# Patient Record
Sex: Male | Born: 1945 | Race: Black or African American | Hispanic: No | Marital: Married
Health system: Southern US, Community
[De-identification: ages and names within clinical notes are randomized; demographics above are authoritative.]

## PROBLEM LIST (undated history)

## (undated) DIAGNOSIS — F329 Major depressive disorder, single episode, unspecified: Secondary | ICD-10-CM

## (undated) DIAGNOSIS — H409 Unspecified glaucoma: Secondary | ICD-10-CM

## (undated) DIAGNOSIS — J181 Lobar pneumonia, unspecified organism: Secondary | ICD-10-CM

## (undated) DIAGNOSIS — I482 Chronic atrial fibrillation, unspecified: Secondary | ICD-10-CM

## (undated) DIAGNOSIS — F32A Depression, unspecified: Secondary | ICD-10-CM

## (undated) DIAGNOSIS — G9341 Metabolic encephalopathy: Secondary | ICD-10-CM

## (undated) DIAGNOSIS — F419 Anxiety disorder, unspecified: Secondary | ICD-10-CM

## (undated) DIAGNOSIS — D649 Anemia, unspecified: Secondary | ICD-10-CM

## (undated) DIAGNOSIS — J9621 Acute and chronic respiratory failure with hypoxia: Principal | ICD-10-CM

## (undated) DIAGNOSIS — F0280 Dementia in other diseases classified elsewhere without behavioral disturbance: Secondary | ICD-10-CM

## (undated) DIAGNOSIS — J449 Chronic obstructive pulmonary disease, unspecified: Secondary | ICD-10-CM

## (undated) DIAGNOSIS — G309 Alzheimer's disease, unspecified: Secondary | ICD-10-CM

---

## 2018-02-13 ENCOUNTER — Inpatient Hospital Stay
Admission: AD | Admit: 2018-02-13 | Discharge: 2018-03-26 | Disposition: E | Payer: Medicare Other | Source: Other Acute Inpatient Hospital | Attending: Internal Medicine | Admitting: Internal Medicine

## 2018-02-13 ENCOUNTER — Other Ambulatory Visit (HOSPITAL_COMMUNITY): Payer: Medicare Other

## 2018-02-13 ENCOUNTER — Ambulatory Visit (HOSPITAL_COMMUNITY)
Admission: AD | Admit: 2018-02-13 | Discharge: 2018-02-13 | Disposition: A | Discharge status: Home / Self Care | Payer: Medicare Other | Source: Other Acute Inpatient Hospital | Attending: Internal Medicine | Admitting: Internal Medicine

## 2018-02-13 DIAGNOSIS — Z4659 Encounter for fitting and adjustment of other gastrointestinal appliance and device: Secondary | ICD-10-CM

## 2018-02-13 DIAGNOSIS — J9621 Acute and chronic respiratory failure with hypoxia: Secondary | ICD-10-CM

## 2018-02-13 DIAGNOSIS — J9690 Respiratory failure, unspecified, unspecified whether with hypoxia or hypercapnia: Secondary | ICD-10-CM

## 2018-02-13 DIAGNOSIS — T85598A Other mechanical complication of other gastrointestinal prosthetic devices, implants and grafts, initial encounter: Secondary | ICD-10-CM

## 2018-02-13 DIAGNOSIS — Z01818 Encounter for other preprocedural examination: Secondary | ICD-10-CM

## 2018-02-13 DIAGNOSIS — G9341 Metabolic encephalopathy: Secondary | ICD-10-CM

## 2018-02-13 DIAGNOSIS — Z431 Encounter for attention to gastrostomy: Secondary | ICD-10-CM

## 2018-02-13 DIAGNOSIS — I482 Chronic atrial fibrillation, unspecified: Secondary | ICD-10-CM

## 2018-02-13 DIAGNOSIS — Z0189 Encounter for other specified special examinations: Secondary | ICD-10-CM

## 2018-02-13 DIAGNOSIS — J449 Chronic obstructive pulmonary disease, unspecified: Secondary | ICD-10-CM | POA: Diagnosis present

## 2018-02-13 DIAGNOSIS — J189 Pneumonia, unspecified organism: Secondary | ICD-10-CM

## 2018-02-13 DIAGNOSIS — T8579XA Infection and inflammatory reaction due to other internal prosthetic devices, implants and grafts, initial encounter: Secondary | ICD-10-CM

## 2018-02-13 DIAGNOSIS — J181 Lobar pneumonia, unspecified organism: Secondary | ICD-10-CM

## 2018-02-13 HISTORY — DX: Lobar pneumonia, unspecified organism: J18.1

## 2018-02-13 HISTORY — DX: Anxiety disorder, unspecified: F41.9

## 2018-02-13 HISTORY — DX: Depression, unspecified: F32.A

## 2018-02-13 HISTORY — DX: Dementia in other diseases classified elsewhere, unspecified severity, without behavioral disturbance, psychotic disturbance, mood disturbance, and anxiety: F02.80

## 2018-02-13 HISTORY — DX: Major depressive disorder, single episode, unspecified: F32.9

## 2018-02-13 HISTORY — DX: Chronic obstructive pulmonary disease, unspecified: J44.9

## 2018-02-13 HISTORY — DX: Acute and chronic respiratory failure with hypoxia: J96.21

## 2018-02-13 HISTORY — DX: Anemia, unspecified: D64.9

## 2018-02-13 HISTORY — DX: Chronic atrial fibrillation, unspecified: I48.20

## 2018-02-13 HISTORY — DX: Unspecified glaucoma: H40.9

## 2018-02-13 HISTORY — DX: Metabolic encephalopathy: G93.41

## 2018-02-13 HISTORY — DX: Alzheimer's disease, unspecified: G30.9

## 2018-02-14 ENCOUNTER — Encounter: Payer: Self-pay | Admitting: Internal Medicine

## 2018-02-14 DIAGNOSIS — J181 Lobar pneumonia, unspecified organism: Secondary | ICD-10-CM | POA: Diagnosis not present

## 2018-02-14 DIAGNOSIS — J9621 Acute and chronic respiratory failure with hypoxia: Secondary | ICD-10-CM

## 2018-02-14 DIAGNOSIS — J449 Chronic obstructive pulmonary disease, unspecified: Secondary | ICD-10-CM | POA: Diagnosis present

## 2018-02-14 DIAGNOSIS — G9341 Metabolic encephalopathy: Secondary | ICD-10-CM

## 2018-02-14 DIAGNOSIS — I482 Chronic atrial fibrillation, unspecified: Secondary | ICD-10-CM

## 2018-02-14 DIAGNOSIS — J4489 Other specified chronic obstructive pulmonary disease: Secondary | ICD-10-CM | POA: Diagnosis present

## 2018-02-14 HISTORY — DX: Lobar pneumonia, unspecified organism: J18.1

## 2018-02-14 HISTORY — DX: Chronic atrial fibrillation, unspecified: I48.20

## 2018-02-14 HISTORY — DX: Acute and chronic respiratory failure with hypoxia: J96.21

## 2018-02-14 HISTORY — DX: Metabolic encephalopathy: G93.41

## 2018-02-14 LAB — CBC
HEMATOCRIT: 36.3 % — AB (ref 39.0–52.0)
HEMOGLOBIN: 11.2 g/dL — AB (ref 13.0–17.0)
MCH: 27 pg (ref 26.0–34.0)
MCHC: 30.9 g/dL (ref 30.0–36.0)
MCV: 87.5 fL (ref 78.0–100.0)
Platelets: 363 10*3/uL (ref 150–400)
RBC: 4.15 MIL/uL — AB (ref 4.22–5.81)
RDW: 17.4 % — ABNORMAL HIGH (ref 11.5–15.5)
WBC: 18.4 10*3/uL — ABNORMAL HIGH (ref 4.0–10.5)

## 2018-02-14 LAB — COMPREHENSIVE METABOLIC PANEL
ALK PHOS: 82 U/L (ref 38–126)
ALT: 205 U/L — AB (ref 0–44)
AST: 101 U/L — ABNORMAL HIGH (ref 15–41)
Albumin: 2.1 g/dL — ABNORMAL LOW (ref 3.5–5.0)
Anion gap: 6 (ref 5–15)
BILIRUBIN TOTAL: 0.9 mg/dL (ref 0.3–1.2)
BUN: 33 mg/dL — ABNORMAL HIGH (ref 8–23)
CALCIUM: 9.4 mg/dL (ref 8.9–10.3)
CO2: 22 mmol/L (ref 22–32)
CREATININE: 0.8 mg/dL (ref 0.61–1.24)
Chloride: 114 mmol/L — ABNORMAL HIGH (ref 98–111)
GFR calc Af Amer: >60 mL/min (ref >60)
GFR calc non Af Amer: >60 mL/min (ref >60)
GLUCOSE: 148 mg/dL — AB (ref 70–99)
Potassium: 4.3 mmol/L (ref 3.5–5.1)
Sodium: 142 mmol/L (ref 135–145)
TOTAL PROTEIN: 5.4 g/dL — AB (ref 6.5–8.1)

## 2018-02-14 LAB — CK TOTAL AND CKMB (NOT AT ARMC)
CK, MB: 13.5 ng/mL — ABNORMAL HIGH (ref 0.5–5.0)
Relative Index: 11.8 — ABNORMAL HIGH (ref 0.0–2.5)
Total CK: 114 U/L (ref 49–397)

## 2018-02-14 LAB — MAGNESIUM: Magnesium: 2.3 mg/dL (ref 1.7–2.4)

## 2018-02-14 LAB — DIGOXIN LEVEL: Digoxin Level: <0.2 ng/mL — ABNORMAL LOW (ref 0.8–2.0)

## 2018-02-14 NOTE — Consult Note (Signed)
[facility]  Date of Service: 02/14/2018  PULMONARY CONSULT   Peter LeoDavid Mcdowell  ION:[id number]  DOB: [birth year]   DOA: 02/04/2018  Referring Physician: Carron CurieAli Hijazi, MD  HPI: Peter Mcdowell is a 72 y.o. male seen for follow up of Acute on Chronic Respiratory Failure.  Patient has multiple medical problems including respiratory failure encephalopathy dementia presented to the hospital basically for sepsis and shock.  On presentation patient was hypertensive started on aggressive antibiotic therapy to include vancomycin meropenem aztreonam and Zyvox.  Patient was also found to have C. difficile colitis which was treated by p.o. vancomycin.  Patient has had ongoing confusion also.  The patient was eventually found to have bilateral pneumonia which was felt to be healthcare associated.  Patient's antibiotics were adjusted also.  During the stay patient developed rhabdomyolysis.  Also with chronic atrial fibrillation.  Patient is now transferred to our facility for further management and weaning currently is on the ventilator on full vent support  Review of Systems:  ROS performed and is unremarkable other than noted above.  Past Medical History:  Diagnosis Date  . Acute metabolic encephalopathy 02/14/2018  . Acute on chronic respiratory failure with hypoxia (HCC) 02/14/2018  . Alzheimer's dementia   . Anxiety   . Atrial fibrillation, chronic (HCC) 02/14/2018  . Chronic anemia   . Depression   . Glaucoma   . Lobar pneumonia (HCC) 02/14/2018  . Obstructive chronic bronchitis without exacerbation (HCC)     History reviewed. No pertinent surgical history.  Social History:    has an unknown smoking status. He has never used smokeless tobacco. He reports that he drank alcohol. He reports that he has current or past drug history.  Family History: Non-Contributory to the present illness  Allergies not on  file  Medications: Reviewed on Rounds  Physical Exam:  Vitals: Temperature 98.2 pulse 117 respiratory rate 28 blood pressure 115/69 saturations 94%  Ventilator Settings mode of ventilation assist control FiO2 20% tidal volume 497 PEEP 5  . General: Comfortable at this time . Eyes: Grossly normal lids, irises & conjunctiva . ENT: grossly tongue is normal . Neck: no obvious mass . Cardiovascular: S1-S2 normal irregular rhythm . Respiratory: Coarse breath sounds are noted few scattered rhonchi . Abdomen: Obese soft nontender . Skin: no rash seen on limited exam . Musculoskeletal: not rigid . Psychiatric:unable to assess . Neurologic: no seizure no involuntary movements         Labs on Admission:  Basic Metabolic Panel: Recent Labs  Lab 02/14/18 0612  NA 142  K 4.3  CL 114*  CO2 22  GLUCOSE 148*  BUN 33*  CREATININE 0.80  CALCIUM 9.4  MG 2.3    Liver Function Tests: Recent Labs  Lab 02/14/18 0612  AST 101*  ALT 205*  ALKPHOS 82  BILITOT 0.9  PROT 5.4*  ALBUMIN 2.1*   No results for input(s): LIPASE, AMYLASE in the last 168 hours. No results for input(s): AMMONIA in the last 168 hours.  CBC: Recent Labs  Lab 02/14/18 0612  WBC 18.4*  HGB 11.2*  HCT 36.3*  MCV 87.5  PLT 363    Cardiac Enzymes: Recent Labs  Lab 02/14/18 0612  CKTOTAL 114  CKMB 13.5*    BNP (last 3 results) No results for input(s): BNP in the last 8760 hours.  ProBNP (last 3 results) No results for input(s): PROBNP in the last 8760 hours.   Radiological Exams on Admission:  Dg Chest Port 1 View  Result Date: 02/14/2018 CLINICAL DATA:  Respiratory failure.  Shortness of breath. EXAM: PORTABLE CHEST 1 VIEW COMPARISON:  Abdominal radiograph earlier this day. FINDINGS: Enteric tube is in place with side-port not well visualized. Left upper extremity PICC with tip in the distal SVC. An additional tubing may project over the thoracic inlet, however is only faintly visualized and  overlaps the enteric tube. Heart size upper normal. Hazy opacity in the right lower hemithorax suspicious for combination of pleural fluid and atelectasis/airspace disease. Lesser retrocardiac opacity. Patchy bilateral suprahilar opacities. No visualized pneumothorax, right lung apex not included in the field of view. The bones are under mineralized. IMPRESSION: 1. Enteric tube in place with side-port not well visualized. Possible additional tubing overlying the thoracic inlet which may be an endotracheal tube, however overlaps the enteric tube and may represent external overlap. Recommend correlation with support apparatus. Left upper extremity PICC tip in the SVC. 2. Hazy opacities throughout the right mid lower hemithorax likely combination of pleural fluid and airspace disease/atelectasis. Similar but lesser left basilar opacity. 3. Patchy bilateral suprahilar opacities may be vascular congestion, atelectasis or airspace disease. No prior exams available for comparison. Electronically Signed   By: Rubye Oaks M.D.   On: 02/14/2018 00:00   Dg Abd Portable 1v  Result Date: 02/12/2018 CLINICAL DATA:  Check gastric catheter placement EXAM: PORTABLE ABDOMEN - 1 VIEW COMPARISON:  None. FINDINGS: Gastric catheter is noted with the tip within the stomach. The proximal side port lies at the gastroesophageal junction. This could be advanced a few cm. No other focal abnormality is noted. IMPRESSION: Gastric catheter as described. Electronically Signed   By: Alcide Clever M.D.   On: 01/26/2018 21:55    Assessment/Plan Principal Problem:   Acute on chronic respiratory failure with hypoxia (HCC) Active Problems:   Acute metabolic encephalopathy   Lobar pneumonia (HCC)   Atrial fibrillation, chronic (HCC)   Obstructive chronic bronchitis without exacerbation (HCC)   1. Acute on chronic respiratory failure with hypoxia patient has been started on weaning protocol did about 2 hours on pressure support.  Seem  to tolerate it fairly well.  We will continue to advance to pressure support as tolerated continue secretion management pulmonary toilet.  Patient has an ET tube in place with difficult positioning.  May eventually need to have a tracheostomy 2. Acute metabolic encephalopathy as a result of the pneumonia as well as underlying dementia. 3. Healthcare associated pneumonia treated with antibiotics we will continue to follow-up on x-rays.  On the last chest x-ray patient did have some patchy infiltrates noted we will need to follow these to clearing 4. Chronic atrial fibrillation currently the rate is controlled we will continue with present management overall patient prognosis guarded. 5. COPD advanced disease right now we will continue with supportive care overall prognosis as mentioned is guarded  I have personally seen and evaluated the patient, evaluated laboratory and imaging results, formulated the assessment and plan and placed orders. The Patient requires high complexity decision making for assessment and support.  Case was discussed on Rounds with the Respiratory Therapy Staff Time Spent patient is critically ill in danger of cardiac arrest.  Patient has an unstable airway will need close monitoring.  I discussed the case with the primary care team as well as treatment team  Yevonne Pax, MD [facility] Pulmonary Critical Care Medicine Sleep Medicine

## 2018-02-15 DIAGNOSIS — I482 Chronic atrial fibrillation: Secondary | ICD-10-CM | POA: Diagnosis not present

## 2018-02-15 DIAGNOSIS — J9621 Acute and chronic respiratory failure with hypoxia: Secondary | ICD-10-CM | POA: Diagnosis not present

## 2018-02-15 DIAGNOSIS — J181 Lobar pneumonia, unspecified organism: Secondary | ICD-10-CM | POA: Diagnosis not present

## 2018-02-15 DIAGNOSIS — G9341 Metabolic encephalopathy: Secondary | ICD-10-CM | POA: Diagnosis not present

## 2018-02-15 LAB — BLOOD GAS, ARTERIAL
Acid-Base Excess: 1.9 mmol/L (ref 0.0–2.0)
Bicarbonate: 25.7 mmol/L (ref 20.0–28.0)
FIO2: 28
O2 SAT: 97.4 %
PEEP: 5 cmH2O
PH ART: 7.442 (ref 7.350–7.450)
Patient temperature: 99
RATE: 15 resp/min
VT: 480 mL
pCO2 arterial: 38.3 mmHg (ref 32.0–48.0)
pO2, Arterial: 99.8 mmHg (ref 83.0–108.0)

## 2018-02-15 NOTE — Progress Notes (Signed)
Pulmonary Critical Care Medicine [facility] [place]   PULMONARY CRITICAL CARE SERVICE  PROGRESS NOTE  Date of Service: 02/15/2018  Peter Mcdowell  ZOX:[id number]  DOB: 1945-11-30   DOA: 01/30/2018  Referring Physician: Carron Curie, MD  HPI: Dia Peter Mcdowell is a 72 y.o. male seen for follow up of Acute on Chronic Respiratory Failure.  Patient is on full support on pressure support at this time.  Has been weaning for a goal of 6 hours so far looks good.  Patient has ET tube in place high risk airway  Medications: Reviewed on Rounds  Physical Exam:  Vitals: Temperature 98.7 pulse 95 respiratory rate 22 blood pressure 109/63 saturations 99%  Ventilator Settings mode of ventilation pressure support FiO2 28% tidal volume 586 PEEP 5 pressure support 12  . General: Comfortable at this time . Eyes: Grossly normal lids, irises & conjunctiva . ENT: grossly tongue is normal . Neck: no obvious mass . Cardiovascular: S1 S2 normal no gallop . Respiratory: Scattered few distant rhonchi . Abdomen: soft . Skin: no rash seen on limited exam . Musculoskeletal: not rigid . Psychiatric:unable to assess . Neurologic: no seizure no involuntary movements         Lab Data:   Basic Metabolic Panel: Recent Labs  Lab 02/14/18 0612  NA 142  K 4.3  CL 114*  CO2 22  GLUCOSE 148*  BUN 33*  CREATININE 0.80  CALCIUM 9.4  MG 2.3    Liver Function Tests: Recent Labs  Lab 02/14/18 0612  AST 101*  ALT 205*  ALKPHOS 82  BILITOT 0.9  PROT 5.4*  ALBUMIN 2.1*   No results for input(s): LIPASE, AMYLASE in the last 168 hours. No results for input(s): AMMONIA in the last 168 hours.  CBC: Recent Labs  Lab 02/14/18 0612  WBC 18.4*  HGB 11.2*  HCT 36.3*  MCV 87.5  PLT 363    Cardiac Enzymes: Recent Labs  Lab 02/14/18 0612  CKTOTAL 114  CKMB 13.5*    BNP (last 3 results) No results for input(s): BNP in the last 8760 hours.  ProBNP (last 3 results) No results for  input(s): PROBNP in the last 8760 hours.  Radiological Exams: Dg Chest Port 1 View  Result Date: 02/14/2018 CLINICAL DATA:  Respiratory failure.  Shortness of breath. EXAM: PORTABLE CHEST 1 VIEW COMPARISON:  Abdominal radiograph earlier this day. FINDINGS: Enteric tube is in place with side-port not well visualized. Left upper extremity PICC with tip in the distal SVC. An additional tubing may project over the thoracic inlet, however is only faintly visualized and overlaps the enteric tube. Heart size upper normal. Hazy opacity in the right lower hemithorax suspicious for combination of pleural fluid and atelectasis/airspace disease. Lesser retrocardiac opacity. Patchy bilateral suprahilar opacities. No visualized pneumothorax, right lung apex not included in the field of view. The bones are under mineralized. IMPRESSION: 1. Enteric tube in place with side-port not well visualized. Possible additional tubing overlying the thoracic inlet which may be an endotracheal tube, however overlaps the enteric tube and may represent external overlap. Recommend correlation with support apparatus. Left upper extremity PICC tip in the SVC. 2. Hazy opacities throughout the right mid lower hemithorax likely combination of pleural fluid and airspace disease/atelectasis. Similar but lesser left basilar opacity. 3. Patchy bilateral suprahilar opacities may be vascular congestion, atelectasis or airspace disease. No prior exams available for comparison. Electronically Signed   By: Rubye Oaks M.D.   On: 02/14/2018 00:00   Dg Abd Portable  1v  Result Date: 02/10/2018 CLINICAL DATA:  Check gastric catheter placement EXAM: PORTABLE ABDOMEN - 1 VIEW COMPARISON:  None. FINDINGS: Gastric catheter is noted with the tip within the stomach. The proximal side port lies at the gastroesophageal junction. This could be advanced a few cm. No other focal abnormality is noted. IMPRESSION: Gastric catheter as described. Electronically  Signed   By: Alcide CleverMark  Lukens M.D.   On: 01/29/2018 21:55    Assessment/Plan Principal Problem:   Acute on chronic respiratory failure with hypoxia (HCC) Active Problems:   Acute metabolic encephalopathy   Lobar pneumonia (HCC)   Atrial fibrillation, chronic (HCC)   Obstructive chronic bronchitis without exacerbation (HCC)   1. Acute on chronic respiratory failure with hypoxia at this time patient is weaning we are going to shoot for goal of 6 hours.  Patient has a high risk airway with the ET tube in place and he has anatomical distorsion which requires ongoing close monitoring. 2. Acute metabolic encephalopathy grossly unchanged 3. Lobar pneumonia treated with antibiotics we will continue to follow along. 4. Chronic atrial fibrillation rate is controlled continue with present management 5. COPD severe disease continue with supportive care   I have personally seen and evaluated the patient, evaluated laboratory and imaging results, formulated the assessment and plan and placed orders. The Patient requires high complexity decision making for assessment and support.  Case was discussed on Rounds with the Respiratory Therapy Staff patient is critically ill with high risk airway time 35 minutes patient is in danger of cardiac arrest and death.  Peter PaxSaadat A Khan, MD [facility] Pulmonary Critical Care Medicine Sleep Medicine

## 2018-02-16 LAB — TSH: TSH: 1.638 u[IU]/mL (ref 0.350–4.500)

## 2018-02-17 LAB — CBC WITH DIFFERENTIAL/PLATELET
ABS IMMATURE GRANULOCYTES: 0.2 10*3/uL — AB (ref 0.0–0.1)
Basophils Absolute: 0 10*3/uL (ref 0.0–0.1)
Basophils Relative: 0 %
EOS PCT: 0 %
Eosinophils Absolute: 0 10*3/uL (ref 0.0–0.7)
HEMATOCRIT: 35.1 % — AB (ref 39.0–52.0)
HEMOGLOBIN: 10.5 g/dL — AB (ref 13.0–17.0)
Immature Granulocytes: 1 %
LYMPHS ABS: 0.8 10*3/uL (ref 0.7–4.0)
LYMPHS PCT: 5 %
MCH: 27 pg (ref 26.0–34.0)
MCHC: 29.9 g/dL — ABNORMAL LOW (ref 30.0–36.0)
MCV: 90.2 fL (ref 78.0–100.0)
MONO ABS: 0.7 10*3/uL (ref 0.1–1.0)
MONOS PCT: 5 %
Neutro Abs: 13.4 10*3/uL — ABNORMAL HIGH (ref 1.7–7.7)
Neutrophils Relative %: 89 %
Platelets: 207 10*3/uL (ref 150–400)
RBC: 3.89 MIL/uL — ABNORMAL LOW (ref 4.22–5.81)
RDW: 18.6 % — ABNORMAL HIGH (ref 11.5–15.5)
WBC: 15 10*3/uL — ABNORMAL HIGH (ref 4.0–10.5)

## 2018-02-17 LAB — COMPREHENSIVE METABOLIC PANEL
ALK PHOS: 58 U/L (ref 38–126)
ALT: 64 U/L — AB (ref 0–44)
ANION GAP: 5 (ref 5–15)
AST: 33 U/L (ref 15–41)
Albumin: 1.7 g/dL — ABNORMAL LOW (ref 3.5–5.0)
BILIRUBIN TOTAL: 0.2 mg/dL — AB (ref 0.3–1.2)
BUN: 24 mg/dL — ABNORMAL HIGH (ref 8–23)
CALCIUM: 8.4 mg/dL — AB (ref 8.9–10.3)
CO2: 27 mmol/L (ref 22–32)
CREATININE: 0.63 mg/dL (ref 0.61–1.24)
Chloride: 115 mmol/L — ABNORMAL HIGH (ref 98–111)
GFR calc Af Amer: >60 mL/min (ref >60)
GFR calc non Af Amer: >60 mL/min (ref >60)
GLUCOSE: 113 mg/dL — AB (ref 70–99)
Potassium: 3.9 mmol/L (ref 3.5–5.1)
Sodium: 147 mmol/L — ABNORMAL HIGH (ref 135–145)
TOTAL PROTEIN: 4.6 g/dL — AB (ref 6.5–8.1)

## 2018-02-17 LAB — MAGNESIUM: Magnesium: 2.4 mg/dL (ref 1.7–2.4)

## 2018-02-18 ENCOUNTER — Other Ambulatory Visit (HOSPITAL_COMMUNITY): Payer: Medicare Other

## 2018-02-18 DIAGNOSIS — I482 Chronic atrial fibrillation: Secondary | ICD-10-CM | POA: Diagnosis not present

## 2018-02-18 DIAGNOSIS — J181 Lobar pneumonia, unspecified organism: Secondary | ICD-10-CM | POA: Diagnosis not present

## 2018-02-18 DIAGNOSIS — G9341 Metabolic encephalopathy: Secondary | ICD-10-CM | POA: Diagnosis not present

## 2018-02-18 DIAGNOSIS — J9621 Acute and chronic respiratory failure with hypoxia: Secondary | ICD-10-CM | POA: Diagnosis not present

## 2018-02-18 LAB — VITAMIN B12: Vitamin B-12: 378 pg/mL (ref 180–914)

## 2018-02-18 NOTE — Progress Notes (Signed)
[facility]   PULMONARY CRITICAL CARE SERVICE  PROGRESS NOTE  Date of Service: 02/18/2018  Peter LeoDavid Funari  XBJ:[id number]  DOB: 1945/09/15   DOA: 11-12-17  Referring Physician: Carron CurieAli Hijazi, MD  HPI: Peter Mcdowell is a 72 y.o. male seen for follow up of Acute on Chronic Respiratory Failure.  Patient is on pressure support weaning protocol tolerating well so far  Medications: Reviewed on Rounds  Physical Exam:  Vitals: Temperature 98.4 pulse 95 respiratory 24 blood pressure 118/65 saturations 96%  Ventilator Settings mode of ventilation pressure support FiO2 20% tidal 2522 pressure support 12 PEEP 5  . General: Comfortable at this time . Eyes: Grossly normal lids, irises & conjunctiva . ENT: grossly tongue is normal . Neck: no obvious mass . Cardiovascular: S1 S2 normal no gallop . Respiratory: No rhonchi no rales are noted . Abdomen: soft . Skin: no rash seen on limited exam . Musculoskeletal: not rigid . Psychiatric:unable to assess . Neurologic: no seizure no involuntary movements         Lab Data:   Basic Metabolic Panel: Recent Labs  Lab 02/14/18 0612 02/17/18 0909  NA 142 147*  K 4.3 3.9  CL 114* 115*  CO2 22 27  GLUCOSE 148* 113*  BUN 33* 24*  CREATININE 0.80 0.63  CALCIUM 9.4 8.4*  MG 2.3 2.4    Liver Function Tests: Recent Labs  Lab 02/14/18 0612 02/17/18 0909  AST 101* 33  ALT 205* 64*  ALKPHOS 82 58  BILITOT 0.9 0.2*  PROT 5.4* 4.6*  ALBUMIN 2.1* 1.7*   No results for input(s): LIPASE, AMYLASE in the last 168 hours. No results for input(s): AMMONIA in the last 168 hours.  CBC: Recent Labs  Lab 02/14/18 0612 02/17/18 0909  WBC 18.4* 15.0*  NEUTROABS  --  13.4*  HGB 11.2* 10.5*  HCT 36.3* 35.1*  MCV 87.5 90.2  PLT 363 207    Cardiac Enzymes: Recent Labs  Lab 02/14/18 0612  CKTOTAL 114  CKMB 13.5*    BNP (last 3 results) No results for input(s): BNP in the last 8760  hours.  ProBNP (last 3 results) No results for input(s): PROBNP in the last 8760 hours.  Radiological Exams: No results found.  Assessment/Plan Principal Problem:   Acute on chronic respiratory failure with hypoxia (HCC) Active Problems:   Acute metabolic encephalopathy   Lobar pneumonia (HCC)   Atrial fibrillation, chronic (HCC)   Obstructive chronic bronchitis without exacerbation (HCC)   1. Acute on chronic respiratory failure with hypoxia we will continue weaning on the protocol continue pulmonary toilet secretion management 2. Acute metabolic encephalopathy at baseline we will monitor 3. Lobar pneumonia treated continue to follow 4. Atrial fibrillation chronic rate controlled 5. COPD at baseline   I have personally seen and evaluated the patient, evaluated laboratory and imaging results, formulated the assessment and plan and placed orders. The Patient requires high complexity decision making for assessment and support.  Case was discussed on Rounds with the Respiratory Therapy Staff  Yevonne PaxSaadat A Brynne Doane, MD [facility] Pulmonary Critical Care Medicine Sleep Medicine

## 2018-02-19 ENCOUNTER — Inpatient Hospital Stay (HOSPITAL_COMMUNITY)
Admission: AD | Admit: 2018-02-19 | Discharge: 2018-02-19 | Disposition: A | Discharge status: Home / Self Care | Payer: Medicare Other | Source: Other Acute Inpatient Hospital | Attending: Internal Medicine | Admitting: Internal Medicine

## 2018-02-19 DIAGNOSIS — J181 Lobar pneumonia, unspecified organism: Secondary | ICD-10-CM | POA: Diagnosis not present

## 2018-02-19 DIAGNOSIS — J9621 Acute and chronic respiratory failure with hypoxia: Secondary | ICD-10-CM | POA: Diagnosis not present

## 2018-02-19 DIAGNOSIS — G9341 Metabolic encephalopathy: Secondary | ICD-10-CM | POA: Diagnosis not present

## 2018-02-19 DIAGNOSIS — I482 Chronic atrial fibrillation: Secondary | ICD-10-CM | POA: Diagnosis not present

## 2018-02-19 NOTE — Procedures (Signed)
ELECTROENCEPHALOGRAM REPORT   Patient: Peter Mcdowell       Room #: 5E04C EEG No. ID: [id number] Age: 72 y.o.        Sex: male Referring Physician: Hijazi Report Date:  02/19/2018        Interpreting Physician: Thana FarrEYNOLDS, Lucio Litsey  History: Peter Mcdowell is an 72 y.o. male with acute on chronic respiratory failure.    Medications:  None listed  Conditions of Recording:  This is a 21 channel routine scalp EEG performed with bipolar and monopolar montages arranged in accordance to the international 10/20 system of electrode placement. One channel was dedicated to EKG recording.  The patient is in the intubated but not sedated state.  Description:  The background activity is slow and consists of a low voltage mixture of theta and delta activity.  This activity is poorly organized and diffusely distributed.  There are also frequent periodic discharges of triphasic morphology.  These are noted bilaterally and have a frontal predominance. Stage II sleep is not obtained. Hyperventilation and intermittent photic stimulation were not performed.   IMPRESSION: This is an abnormal electroencephalogram secondary to general background slowing and frequent triphasic activity.  This finding is most consistent with an encephalopathy.     Thana FarrLeslie Taffy Delconte, MD Neurology [phone] 02/19/2018, 2:15 PM

## 2018-02-19 NOTE — Progress Notes (Signed)
Pulmonary Critical Care Medicine [name] [place]   PULMONARY CRITICAL CARE SERVICE  PROGRESS NOTE  Date of Service: 02/19/2018  Peter Mcdowell Cullen  [id number]  DOB: [birth year]   DOA: 02/19/2018  Referring Physician: Carron CurieAli Hijazi, MD  HPI: Peter Mcdowell Hegeman is a 72 y.o. male seen for follow up of Acute on Chronic Respiratory Failure.  Patient had an EEG done which was abnormal showing background slowing and triphasic activity consistent with encephalopathy  Medications: Reviewed on Rounds  Physical Exam:  Vitals: Temperature 98.0 pulse 110 respiratory rate 22 blood pressure 107/71 saturations 99%  Ventilator Settings currently on pressure support FiO2 20% per support 12 PEEP 5  . General: Comfortable at this time . Eyes: Grossly normal lids, irises & conjunctiva . ENT: grossly tongue is normal . Neck: no obvious mass . Cardiovascular: S1 S2 normal no gallop . Respiratory: No rhonchi no rales at this time. . Abdomen: soft . Skin: no rash seen on limited exam . Musculoskeletal: not rigid . Psychiatric:unable to assess . Neurologic: no seizure no involuntary movements         Lab Data:   Basic Metabolic Panel: Recent Labs  Lab 02/14/18 0612 02/17/18 0909  NA 142 147*  K 4.3 3.9  CL 114* 115*  CO2 22 27  GLUCOSE 148* 113*  BUN 33* 24*  CREATININE 0.80 0.63  CALCIUM 9.4 8.4*  MG 2.3 2.4    Liver Function Tests: Recent Labs  Lab 02/14/18 0612 02/17/18 0909  AST 101* 33  ALT 205* 64*  ALKPHOS 82 58  BILITOT 0.9 0.2*  PROT 5.4* 4.6*  ALBUMIN 2.1* 1.7*   No results for input(s): LIPASE, AMYLASE in the last 168 hours. No results for input(s): AMMONIA in the last 168 hours.  CBC: Recent Labs  Lab 02/14/18 0612 02/17/18 0909  WBC 18.4* 15.0*  NEUTROABS  --  13.4*  HGB 11.2* 10.5*  HCT 36.3* 35.1*  MCV 87.5 90.2  PLT 363 207    Cardiac Enzymes: Recent Labs  Lab 02/14/18 0612  CKTOTAL 114  CKMB 13.5*    BNP (last 3 results) No results  for input(s): BNP in the last 8760 hours.  ProBNP (last 3 results) No results for input(s): PROBNP in the last 8760 hours.  Radiological Exams: No results found.  Assessment/Plan Principal Problem:   Acute on chronic respiratory failure with hypoxia (HCC) Active Problems:   Acute metabolic encephalopathy   Lobar pneumonia (HCC)   Atrial fibrillation, chronic (HCC)   Obstructive chronic bronchitis without exacerbation (HCC)   1. Acute on chronic respiratory failure with hypoxia we will continue with weaning protocol right now on pressure support goal for 16 hours 2. Metabolic encephalopathy as by EEG shows chronic encephalopathy 3. Lobar pneumonia treated with antibiotics we will continue to follow 4. Chronic atrial fibrillation rate is controlled we will monitor 5. COPD stable at this time continue present therapy   I have personally seen and evaluated the patient, evaluated laboratory and imaging results, formulated the assessment and plan and placed orders. The Patient requires high complexity decision making for assessment and support.  Case was discussed on Rounds with the Respiratory Therapy Staff  Yevonne PaxSaadat A Khan, MD [facility] Pulmonary Critical Care Medicine Sleep Medicine

## 2018-02-19 NOTE — Progress Notes (Signed)
EEG Completed; Results Pending  

## 2018-02-20 DIAGNOSIS — J181 Lobar pneumonia, unspecified organism: Secondary | ICD-10-CM | POA: Diagnosis not present

## 2018-02-20 DIAGNOSIS — J9621 Acute and chronic respiratory failure with hypoxia: Secondary | ICD-10-CM | POA: Diagnosis not present

## 2018-02-20 DIAGNOSIS — G9341 Metabolic encephalopathy: Secondary | ICD-10-CM | POA: Diagnosis not present

## 2018-02-20 DIAGNOSIS — I482 Chronic atrial fibrillation: Secondary | ICD-10-CM | POA: Diagnosis not present

## 2018-02-20 NOTE — Progress Notes (Signed)
[facility]ulmonary [facility] [place]   PULMONARY CRITICAL CARE SERVICE  PROGRESS NOTE  Date of Service: 02/20/2018  Peter Mcdowell  [id number]  DOB: 12-02-1945   DOA: 02/07/2018  Referring Physician: Carron CurieAli Hijazi, MD  HPI: Peter Mcdowell is a 72 y.o. male seen for follow up of Acute on Chronic Respiratory Failure.  Patient right now is on pressure support 25 daily well the goal is for 20 hours currently is on a pressure support 12/5  Medications: Reviewed on Rounds  Physical Exam:  Vitals: Temperature 97.0 pulse 82 respiratory rate 30 blood pressure 130/81 saturations 97%  Ventilator Settings currently is on pressure support wean as noted the goal is 20 hours pressure support 12 PEEP 5 FiO2 is 28%  . General: Comfortable at this time . Eyes: Grossly normal lids, irises & conjunctiva . ENT: grossly tongue is normal . Neck: no obvious mass . Cardiovascular: S1 S2 normal no gallop . Respiratory: Scattered few distant rhonchi noted . Abdomen: soft . Skin: no rash seen on limited exam . Musculoskeletal: not rigid . Psychiatric:unable to assess . Neurologic: no seizure no involuntary movements         Lab Data:   Basic Metabolic Panel: Recent Labs  Lab 02/14/18 0612 02/17/18 0909  NA 142 147*  K 4.3 3.9  CL 114* 115*  CO2 22 27  GLUCOSE 148* 113*  BUN 33* 24*  CREATININE 0.80 0.63  CALCIUM 9.4 8.4*  MG 2.3 2.4    Liver Function Tests: Recent Labs  Lab 02/14/18 0612 02/17/18 0909  AST 101* 33  ALT 205* 64*  ALKPHOS 82 58  BILITOT 0.9 0.2*  PROT 5.4* 4.6*  ALBUMIN 2.1* 1.7*   No results for input(s): LIPASE, AMYLASE in the last 168 hours. No results for input(s): AMMONIA in the last 168 hours.  CBC: Recent Labs  Lab 02/14/18 0612 02/17/18 0909  WBC 18.4* 15.0*  NEUTROABS  --  13.4*  HGB 11.2* 10.5*  HCT 36.3* 35.1*  MCV 87.5 90.2  PLT 363 207    Cardiac Enzymes: Recent Labs  Lab 02/14/18 0612  CKTOTAL 114  CKMB  13.5*    BNP (last 3 results) No results for input(s): BNP in the last 8760 hours.  ProBNP (last 3 results) No results for input(s): PROBNP in the last 8760 hours.  Radiological Exams: No results found.  Assessment/Plan Principal Problem:   Acute on chronic respiratory failure with hypoxia (HCC) Active Problems:   Acute metabolic encephalopathy   Lobar pneumonia (HCC)   Atrial fibrillation, chronic (HCC)   Obstructive chronic bronchitis without exacerbation (HCC)   1. Acute on chronic respiratory failure with hypoxia we will continue with weaning as tolerated.  Hopefully we will be working towards extubation soon 2. Acute metabolic encephalopathy unchanged patient remains at baseline 3. Lobar pneumonia treated we will continue to follow 4. Chronic atrial fibrillation rate is controlled 5. COPD severe disease we will continue with present management.   I have personally seen and evaluated the patient, evaluated laboratory and imaging results, formulated the assessment and plan and placed orders. The Patient requires high complexity decision making for assessment and support.  Case was discussed on Rounds with the Respiratory Therapy Staff  Yevonne PaxSaadat A Khan, MD [facility] Pulmonary Critical Care Medicine Sleep Medicine

## 2018-02-21 ENCOUNTER — Other Ambulatory Visit (HOSPITAL_COMMUNITY): Payer: Medicare Other

## 2018-02-21 DIAGNOSIS — J9621 Acute and chronic respiratory failure with hypoxia: Secondary | ICD-10-CM | POA: Diagnosis not present

## 2018-02-21 DIAGNOSIS — J181 Lobar pneumonia, unspecified organism: Secondary | ICD-10-CM | POA: Diagnosis not present

## 2018-02-21 DIAGNOSIS — G9341 Metabolic encephalopathy: Secondary | ICD-10-CM | POA: Diagnosis not present

## 2018-02-21 DIAGNOSIS — I482 Chronic atrial fibrillation: Secondary | ICD-10-CM | POA: Diagnosis not present

## 2018-02-21 NOTE — Progress Notes (Signed)
Pulmonary Critical Care Medicine [facility] [place]   PULMONARY CRITICAL CARE SERVICE  PROGRESS NOTE  Date of Service: 02/21/2018  Peter Mcdowell  WUJ:[id number]  DOB: [birth year]   DOA: 02/04/2018  Referring Physician: Carron Curie, MD  HPI: Alem Fahl is a 72 y.o. male seen for follow up of Acute on Chronic Respiratory Failure.  Patient is on full vent support.  Has been having issues with tachycardia.  Today did not wean because of the tachycardia.  Yesterday patient did do 20 hours of pressure support.  We are working up tachycardia at this time with cardiology involvement  Medications: Reviewed on Rounds  Physical Exam:  Vitals: Temperature 97.6 pulse 76 respiratory rate 19 blood pressure 123/68 saturations 100%  Ventilator Settings mode of ventilation assist control FiO2 20% tidal volume 480 PEEP 5  . General: Comfortable at this time . Eyes: Grossly normal lids, irises & conjunctiva . ENT: grossly tongue is normal . Neck: no obvious mass . Cardiovascular: S1 S2 normal no gallop . Respiratory: Coarse breath sounds noted bilaterally . Abdomen: soft . Skin: no rash seen on limited exam . Musculoskeletal: not rigid . Psychiatric:unable to assess . Neurologic: no seizure no involuntary movements         Lab Data:   Basic Metabolic Panel: Recent Labs  Lab 02/17/18 0909  NA 147*  K 3.9  CL 115*  CO2 27  GLUCOSE 113*  BUN 24*  CREATININE 0.63  CALCIUM 8.4*  MG 2.4    Liver Function Tests: Recent Labs  Lab 02/17/18 0909  AST 33  ALT 64*  ALKPHOS 58  BILITOT 0.2*  PROT 4.6*  ALBUMIN 1.7*   No results for input(s): LIPASE, AMYLASE in the last 168 hours. No results for input(s): AMMONIA in the last 168 hours.  CBC: Recent Labs  Lab 02/17/18 0909  WBC 15.0*  NEUTROABS 13.4*  HGB 10.5*  HCT 35.1*  MCV 90.2  PLT 207    Cardiac Enzymes: No results for input(s): CKTOTAL, CKMB, CKMBINDEX, TROPONINI in the last 168 hours.  BNP (last 3  results) No results for input(s): BNP in the last 8760 hours.  ProBNP (last 3 results) No results for input(s): PROBNP in the last 8760 hours.  Radiological Exams: Dg Chest Port 1 View  Result Date: 02/21/2018 CLINICAL DATA:  History of pneumonia, follow-up EXAM: PORTABLE CHEST 1 VIEW COMPARISON:  Portable chest x-ray of 02/15/2018 FINDINGS: Abnormal opacity involving the right lung base is again noted most consistent with atelectasis, pneumonia, and right pleural effusion. The left lung appears grossly clear with minimally prominent markings medially. The tip of the endotracheal tube is approximately 3.9 cm above the carina. NG tube extends below the hemidiaphragm. Mild cardiomegaly is stable. A catheter is noted in the soft tissues of the right upper arm with the tip near the axilla. IMPRESSION: Persistent opacity at the right lung base most consistent with pneumonia and pleural effusion. Electronically Signed   By: Dwyane Dee M.D.   On: 02/21/2018 12:13    Assessment/Plan Principal Problem:   Acute on chronic respiratory failure with hypoxia (HCC) Active Problems:   Acute metabolic encephalopathy   Lobar pneumonia (HCC)   Atrial fibrillation, chronic (HCC)   Obstructive chronic bronchitis without exacerbation (HCC)   1. Acute on chronic respiratory failure with hypoxia and was weaning yesterday today the wean is being held we will reassess again tomorrow if the heart rate is better controlled. 2. Acute encephalopathy grossly unchanged we will continue present therapy  3. Lobar pneumonia treated with antibiotics 4. Chronic atrial fibrillation rate is not controlled as discussed above being worked up for the tachycardia 5. COPD at baseline   I have personally seen and evaluated the patient, evaluated laboratory and imaging results, formulated the assessment and plan and placed orders. The Patient requires high complexity decision making for assessment and support.  Case was discussed  on Rounds with the Respiratory Therapy Staff  Yevonne PaxSaadat A Khan, MD [facility] Pulmonary Critical Care Medicine Sleep Medicine

## 2018-02-22 DIAGNOSIS — J181 Lobar pneumonia, unspecified organism: Secondary | ICD-10-CM | POA: Diagnosis not present

## 2018-02-22 DIAGNOSIS — G9341 Metabolic encephalopathy: Secondary | ICD-10-CM | POA: Diagnosis not present

## 2018-02-22 DIAGNOSIS — I482 Chronic atrial fibrillation: Secondary | ICD-10-CM | POA: Diagnosis not present

## 2018-02-22 DIAGNOSIS — J9621 Acute and chronic respiratory failure with hypoxia: Secondary | ICD-10-CM | POA: Diagnosis not present

## 2018-02-22 LAB — BLOOD GAS, ARTERIAL
Acid-Base Excess: 4.2 mmol/L — ABNORMAL HIGH (ref 0.0–2.0)
BICARBONATE: 28 mmol/L (ref 20.0–28.0)
FIO2: 0.28
O2 Saturation: 98.9 %
PEEP/CPAP: 5 cmH2O
PH ART: 7.458 — AB (ref 7.350–7.450)
Patient temperature: 98.6
RATE: 15 resp/min
VT: 480 mL
pCO2 arterial: 40.1 mmHg (ref 32.0–48.0)
pO2, Arterial: 119 mmHg — ABNORMAL HIGH (ref 83.0–108.0)

## 2018-02-22 NOTE — Progress Notes (Signed)
[facility] [place]   PULMONARY CRITICAL CARE SERVICE  PROGRESS NOTE  Date of Service: 02/22/2018  Peter Mcdowell  ZOX:[id number]  DOB: 08-21-1945   DOA: 02/03/2018  Referring Physician: Carron CurieAli Hijazi, MD  HPI: Peter Mcdowell is a 72 y.o. male seen for follow up of Acute on Chronic Respiratory Failure.  Patient has been back on the ventilator.  Has abnormal appearing chest x-ray possible fluid versus infiltrate.  Patient is also been having issues with tachycardia.  Heart rate has been in the upper 120-130 range.  The concern here would be if patient possibly has pulmonary embolism and so I suggested that we get a CT angiogram  Medications: Reviewed on Rounds  Physical Exam:  Vitals: Temperature 98.6 pulse 126 respiratory rate 25 blood pressure 133/80 saturations 98%  Ventilator Settings mode of ventilation is assist control FiO2 28% tidal volume 477 PEEP 5  . General: Comfortable at this time . Eyes: Grossly normal lids, irises & conjunctiva . ENT: grossly tongue is normal . Neck: no obvious mass . Cardiovascular: S1 S2 normal no gallop . Respiratory: Coarse breath sounds few scattered rhonchi . Abdomen: soft . Skin: no rash seen on limited exam . Musculoskeletal: not rigid . Psychiatric:unable to assess . Neurologic: no seizure no involuntary movements         Lab Data:   Basic Metabolic Panel: Recent Labs  Lab 02/17/18 0909  NA 147*  K 3.9  CL 115*  CO2 27  GLUCOSE 113*  BUN 24*  CREATININE 0.63  CALCIUM 8.4*  MG 2.4    Liver Function Tests: Recent Labs  Lab 02/17/18 0909  AST 33  ALT 64*  ALKPHOS 58  BILITOT 0.2*  PROT 4.6*  ALBUMIN 1.7*   No results for input(s): LIPASE, AMYLASE in the last 168 hours. No results for input(s): AMMONIA in the last 168 hours.  CBC: Recent Labs  Lab 02/17/18 0909  WBC 15.0*  NEUTROABS 13.4*  HGB 10.5*  HCT 35.1*  MCV 90.2  PLT 207    Cardiac Enzymes: No results for  input(s): CKTOTAL, CKMB, CKMBINDEX, TROPONINI in the last 168 hours.  BNP (last 3 results) No results for input(s): BNP in the last 8760 hours.  ProBNP (last 3 results) No results for input(s): PROBNP in the last 8760 hours.  Radiological Exams: Dg Chest Port 1 View  Result Date: 02/21/2018 CLINICAL DATA:  History of pneumonia, follow-up EXAM: PORTABLE CHEST 1 VIEW COMPARISON:  Portable chest x-ray of 02/06/2018 FINDINGS: Abnormal opacity involving the right lung base is again noted most consistent with atelectasis, pneumonia, and right pleural effusion. The left lung appears grossly clear with minimally prominent markings medially. The tip of the endotracheal tube is approximately 3.9 cm above the carina. NG tube extends below the hemidiaphragm. Mild cardiomegaly is stable. A catheter is noted in the soft tissues of the right upper arm with the tip near the axilla. IMPRESSION: Persistent opacity at the right lung base most consistent with pneumonia and pleural effusion. Electronically Signed   By: Dwyane DeePaul  Barry M.D.   On: 02/21/2018 12:13    Assessment/Plan Principal Problem:   Acute on chronic respiratory failure with hypoxia (HCC) Active Problems:   Acute metabolic encephalopathy   Lobar pneumonia (HCC)   Atrial fibrillation, chronic (HCC)   Obstructive chronic bronchitis without exacerbation (HCC)   1. Acute on chronic respiratory failure with hypoxia at this time we will continue with the full vent support.  Will titrate oxygen as  tolerated continue pulmonary toilet. 2. Acute encephalopathy patient's unchanged 3. Lobar pneumonia chest x-ray still showing abnormalities.  Suggested getting a CT angiogram to determine if there is any kind of fluid versus obstruction versus pulmonary embolism because of the tachycardia.  Spoke with primary care team to get this ordered. 4. Chronic atrial fibrillation patient has been tachycardic as already noted above trying to get the rate under better  control. 5. COPD at baseline continue with present management prognosis guarded   I have personally seen and evaluated the patient, evaluated laboratory and imaging results, formulated the assessment and plan and placed orders. The Patient requires high complexity decision making for assessment and support.  Case was discussed on Rounds with the Respiratory Therapy Staff time 35 minutes patient is critically ill in danger of cardiac arrest  Yevonne Pax, MD [facility] Pulmonary Critical Care Medicine Sleep Medicine

## 2018-02-23 DIAGNOSIS — J9621 Acute and chronic respiratory failure with hypoxia: Secondary | ICD-10-CM | POA: Diagnosis not present

## 2018-02-23 DIAGNOSIS — I482 Chronic atrial fibrillation: Secondary | ICD-10-CM | POA: Diagnosis not present

## 2018-02-23 DIAGNOSIS — G9341 Metabolic encephalopathy: Secondary | ICD-10-CM | POA: Diagnosis not present

## 2018-02-23 DIAGNOSIS — J181 Lobar pneumonia, unspecified organism: Secondary | ICD-10-CM | POA: Diagnosis not present

## 2018-02-23 NOTE — Progress Notes (Signed)
[facility] [place]   PULMONARY CRITICAL CARE SERVICE  PROGRESS NOTE  Date of Service: 02/23/2018  Peter Mcdowell Blick  UVO:[id number]  DOB: 1946-05-16   DOA: 01/29/2018  Referring Physician: Carron CurieAli Hijazi, MD  HPI: Peter Mcdowell Raj is a 72 y.o. male seen for follow up of Acute on Chronic Respiratory Failure.  Patient was supposed to have a CT scan done yesterday was not able to have it because the midline was not working right now remains on full support on assist control mode  Medications: Reviewed on Rounds  Physical Exam:  Vitals: Temperature 97.0 pulse 129 respiratory rate 24 blood pressure 122/67 saturations 99%  Ventilator Settings mode of ventilation assist control FiO2 20% tidal volume 457 PEEP 5  . General: Comfortable at this time . Eyes: Grossly normal lids, irises & conjunctiva . ENT: grossly tongue is normal . Neck: no obvious mass . Cardiovascular: S1 S2 normal no gallop . Respiratory: Coarse breath sounds few rhonchi are noted . Abdomen: soft . Skin: no rash seen on limited exam . Musculoskeletal: not rigid . Psychiatric:unable to assess . Neurologic: no seizure no involuntary movements         Lab Data:   Basic Metabolic Panel: Recent Labs  Lab 02/17/18 0909  NA 147*  K 3.9  CL 115*  CO2 27  GLUCOSE 113*  BUN 24*  CREATININE 0.63  CALCIUM 8.4*  MG 2.4    Liver Function Tests: Recent Labs  Lab 02/17/18 0909  AST 33  ALT 64*  ALKPHOS 58  BILITOT 0.2*  PROT 4.6*  ALBUMIN 1.7*   No results for input(s): LIPASE, AMYLASE in the last 168 hours. No results for input(s): AMMONIA in the last 168 hours.  CBC: Recent Labs  Lab 02/17/18 0909  WBC 15.0*  NEUTROABS 13.4*  HGB 10.5*  HCT 35.1*  MCV 90.2  PLT 207    Cardiac Enzymes: No results for input(s): CKTOTAL, CKMB, CKMBINDEX, TROPONINI in the last 168 hours.  BNP (last 3 results) No results for input(s): BNP in the last 8760 hours.  ProBNP (last  3 results) No results for input(s): PROBNP in the last 8760 hours.  Radiological Exams: No results found.  Assessment/Plan Principal Problem:   Acute on chronic respiratory failure with hypoxia (HCC) Active Problems:   Acute metabolic encephalopathy   Lobar pneumonia (HCC)   Atrial fibrillation, chronic (HCC)   Obstructive chronic bronchitis without exacerbation (HCC)   1. Acute on chronic respiratory failure with hypoxia we will continue with full vent support at this time.  Patient is going to be attempted on another CT today hopefully. 2. Metabolic encephalopathy grossly unchanged 3. Lobar pneumonia treated as noted we are waiting to get a follow-up CT scan. 4. Chronic atrial fibrillation rate is controlled 5. COPD at baseline continue present management   I have personally seen and evaluated the patient, evaluated laboratory and imaging results, formulated the assessment and plan and placed orders. The Patient requires high complexity decision making for assessment and support.  Case was discussed on Rounds with the Respiratory Therapy Staff  Yevonne PaxSaadat A Jetson Pickrel, MD [facility] Pulmonary Critical Care Medicine Sleep Medicine

## 2018-02-24 DIAGNOSIS — G9341 Metabolic encephalopathy: Secondary | ICD-10-CM | POA: Diagnosis not present

## 2018-02-24 DIAGNOSIS — J9621 Acute and chronic respiratory failure with hypoxia: Secondary | ICD-10-CM | POA: Diagnosis not present

## 2018-02-24 DIAGNOSIS — J181 Lobar pneumonia, unspecified organism: Secondary | ICD-10-CM | POA: Diagnosis not present

## 2018-02-24 DIAGNOSIS — I482 Chronic atrial fibrillation: Secondary | ICD-10-CM | POA: Diagnosis not present

## 2018-02-24 LAB — BASIC METABOLIC PANEL
Anion gap: 9 (ref 5–15)
BUN: 26 mg/dL — AB (ref 8–23)
CHLORIDE: 97 mmol/L — AB (ref 98–111)
CO2: 26 mmol/L (ref 22–32)
CREATININE: 0.68 mg/dL (ref 0.61–1.24)
Calcium: 7.9 mg/dL — ABNORMAL LOW (ref 8.9–10.3)
GFR calc Af Amer: >60 mL/min (ref >60)
GFR calc non Af Amer: >60 mL/min (ref >60)
GLUCOSE: 111 mg/dL — AB (ref 70–99)
POTASSIUM: 5.1 mmol/L (ref 3.5–5.1)
Sodium: 132 mmol/L — ABNORMAL LOW (ref 135–145)

## 2018-02-24 LAB — CBC
HEMATOCRIT: 30.7 % — AB (ref 39.0–52.0)
Hemoglobin: 9.7 g/dL — ABNORMAL LOW (ref 13.0–17.0)
MCH: 27.1 pg (ref 26.0–34.0)
MCHC: 31.6 g/dL (ref 30.0–36.0)
MCV: 85.8 fL (ref 78.0–100.0)
Platelets: 226 10*3/uL (ref 150–400)
RBC: 3.58 MIL/uL — ABNORMAL LOW (ref 4.22–5.81)
RDW: 17.2 % — AB (ref 11.5–15.5)
WBC: 10.5 10*3/uL (ref 4.0–10.5)

## 2018-02-24 NOTE — Progress Notes (Signed)
Pulmonary Critical Care Medicine [facility] [place]   PULMONARY CRITICAL CARE SERVICE  PROGRESS NOTE  Date of Service: 02/24/2018  Peter Mcdowell  [id number]  DOB: [birth year]   DOA: 02/21/2018  Referring Physician: Carron Curie, MD  HPI: Peter Mcdowell is a 72 y.o. male seen for follow up of Acute on Chronic Respiratory Failure.  Patient is on pressure support today trying to shoot for goal of about 16 hours his heart rate still remains an issue  Medications: Reviewed on Rounds  Physical Exam:  Vitals: Temperature 97.6 pulse 120 respiratory rate 20 blood pressure 114/54 saturation 97%  Ventilator Settings mode of ventilation pressure support FiO2 20% tidal volume 543 pressure support 12 PEEP 5  . General: Comfortable at this time . Eyes: Grossly normal lids, irises & conjunctiva . ENT: grossly tongue is normal . Neck: no obvious mass . Cardiovascular: S1 S2 normal no gallop . Respiratory: Few rhonchi are noted . Abdomen: soft . Skin: no rash seen on limited exam . Musculoskeletal: not rigid . Psychiatric:unable to assess . Neurologic: no seizure no involuntary movements         Lab Data:   Basic Metabolic Panel: Recent Labs  Lab 02/24/18 0709  NA 132*  K 5.1  CL 97*  CO2 26  GLUCOSE 111*  BUN 26*  CREATININE 0.68  CALCIUM 7.9*    ABG: Recent Labs  Lab 02/22/18 2040  PHART 7.458*  PCO2ART 40.1  PO2ART 119*  HCO3 28.0  O2SAT 98.9    Liver Function Tests: No results for input(s): AST, ALT, ALKPHOS, BILITOT, PROT, ALBUMIN in the last 168 hours. No results for input(s): LIPASE, AMYLASE in the last 168 hours. No results for input(s): AMMONIA in the last 168 hours.  CBC: Recent Labs  Lab 02/24/18 0709  WBC 10.5  HGB 9.7*  HCT 30.7*  MCV 85.8  PLT 226    Cardiac Enzymes: No results for input(s): CKTOTAL, CKMB, CKMBINDEX, TROPONINI in the last 168 hours.  BNP (last 3 results) No results for input(s): BNP in the last 8760  hours.  ProBNP (last 3 results) No results for input(s): PROBNP in the last 8760 hours.  Radiological Exams: No results found.  Assessment/Plan Principal Problem:   Acute on chronic respiratory failure with hypoxia (HCC) Active Problems:   Acute metabolic encephalopathy   Lobar pneumonia (HCC)   Atrial fibrillation, chronic (HCC)   Obstructive chronic bronchitis without exacerbation (HCC)   1. Acute on chronic respiratory failure with hypoxia continue to wean on pressure support goal 16 hours monitor heart rate closely. 2. Acute encephalopathy unchanged we will continue with present management. 3. Lobar pneumonia treated with antibiotics we will continue to follow. 4. Chronic atrial fibrillation rate is not controlled at this time.  Adjust medications accordingly discussed with primary care team. 5. COPD at baseline continue with present management   I have personally seen and evaluated the patient, evaluated laboratory and imaging results, formulated the assessment and plan and placed orders. The Patient requires high complexity decision making for assessment and support.  Case was discussed on Rounds with the Respiratory Therapy Staff  Yevonne Pax, MD [facility] Pulmonary Critical Care Medicine Sleep Medicine

## 2018-02-24 DEATH — deceased

## 2018-02-25 DIAGNOSIS — I482 Chronic atrial fibrillation: Secondary | ICD-10-CM | POA: Diagnosis not present

## 2018-02-25 DIAGNOSIS — J9621 Acute and chronic respiratory failure with hypoxia: Secondary | ICD-10-CM | POA: Diagnosis not present

## 2018-02-25 DIAGNOSIS — G9341 Metabolic encephalopathy: Secondary | ICD-10-CM | POA: Diagnosis not present

## 2018-02-25 DIAGNOSIS — J181 Lobar pneumonia, unspecified organism: Secondary | ICD-10-CM | POA: Diagnosis not present

## 2018-02-25 NOTE — Progress Notes (Signed)
Pulmonary Critical Care Medicine [facility] [place]   PULMONARY CRITICAL CARE SERVICE  PROGRESS NOTE  Date of Service: 02/25/2018  Adonys Kaden  [id number]  DOB: 1945-08-22   DOA: 01/29/2018  Referring Physician: Carron Curie, MD  HPI: Malone Lavery is a 72 y.o. male seen for follow up of Acute on Chronic Respiratory Failure.  Patient's heart rate is under better control is back on a wean today the goal is for 20 hours on pressure support  Medications: Reviewed on Rounds  Physical Exam:  Vitals: Temperature 97.4 pulse 78 respiratory rate 18 blood pressure 159/87 saturations 100%  Ventilator Settings mode of ventilation pressure support FiO2 20% tidal volume 398 pressure support 12 PEEP 5  . General: Comfortable at this time . Eyes: Grossly normal lids, irises & conjunctiva . ENT: grossly tongue is normal . Neck: no obvious mass . Cardiovascular: S1 S2 normal no gallop . Respiratory: Coarse breath sounds with rhonchi are noted. . Abdomen: soft . Skin: no rash seen on limited exam . Musculoskeletal: not rigid . Psychiatric:unable to assess . Neurologic: no seizure no involuntary movements         Lab Data:   Basic Metabolic Panel: Recent Labs  Lab 02/24/18 0709  NA 132*  K 5.1  CL 97*  CO2 26  GLUCOSE 111*  BUN 26*  CREATININE 0.68  CALCIUM 7.9*    ABG: Recent Labs  Lab 02/22/18 2040  PHART 7.458*  PCO2ART 40.1  PO2ART 119*  HCO3 28.0  O2SAT 98.9    Liver Function Tests: No results for input(s): AST, ALT, ALKPHOS, BILITOT, PROT, ALBUMIN in the last 168 hours. No results for input(s): LIPASE, AMYLASE in the last 168 hours. No results for input(s): AMMONIA in the last 168 hours.  CBC: Recent Labs  Lab 02/24/18 0709  WBC 10.5  HGB 9.7*  HCT 30.7*  MCV 85.8  PLT 226    Cardiac Enzymes: No results for input(s): CKTOTAL, CKMB, CKMBINDEX, TROPONINI in the last 168 hours.  BNP (last 3 results) No results for input(s): BNP in the  last 8760 hours.  ProBNP (last 3 results) No results for input(s): PROBNP in the last 8760 hours.  Radiological Exams: No results found.  Assessment/Plan Principal Problem:   Acute on chronic respiratory failure with hypoxia (HCC) Active Problems:   Acute metabolic encephalopathy   Lobar pneumonia (HCC)   Atrial fibrillation, chronic (HCC)   Obstructive chronic bronchitis without exacerbation (HCC)   1. Acute on chronic respiratory failure with hypoxia continue with a wean on pressure support as mentioned the goal will be for 20 hours. 2. Tachycardia clinically improved now better controlled. 3. Acute metabolic encephalopathy grossly unchanged 4. Chronic atrial fibrillation rate is a little bit better controlled 5. COPD severe disease we will continue to follow 6. Lobar pneumonia treated we will continue monitoring x-rays as needed   I have personally seen and evaluated the patient, evaluated laboratory and imaging results, formulated the assessment and plan and placed orders. The Patient requires high complexity decision making for assessment and support.  Case was discussed on Rounds with the Respiratory Therapy Staff  Yevonne Pax, MD [facility] Pulmonary Critical Care Medicine Sleep Medicine

## 2018-02-26 DIAGNOSIS — I482 Chronic atrial fibrillation: Secondary | ICD-10-CM | POA: Diagnosis not present

## 2018-02-26 DIAGNOSIS — J9621 Acute and chronic respiratory failure with hypoxia: Secondary | ICD-10-CM | POA: Diagnosis not present

## 2018-02-26 DIAGNOSIS — G9341 Metabolic encephalopathy: Secondary | ICD-10-CM | POA: Diagnosis not present

## 2018-02-26 DIAGNOSIS — J181 Lobar pneumonia, unspecified organism: Secondary | ICD-10-CM | POA: Diagnosis not present

## 2018-02-26 NOTE — Progress Notes (Signed)
Pulmonary Critical Care Medicine [facility] [place]   PULMONARY CRITICAL CARE SERVICE  PROGRESS NOTE  Date of Service: 02/26/2018  Peter Mcdowell  [id number]  DOB: 08/29/1945   DOA: 02/16/2018  Referring Physician: Carron Curie, MD  HPI: Peter Mcdowell is a 72 y.o. male seen for follow up of Acute on Chronic Respiratory Failure.  He is on pressure support today the goal is to try for 24 hours heart rate is under better control now  Medications: Reviewed on Rounds  Physical Exam:  Vitals: Temperature 98.6 pulse 100 respiratory rate 22 blood pressure 174/82 saturations 99%  Ventilator Settings mode of ventilation pressure support FiO2 20% tidal volume 477 pressure support 12 PEEP 5  . General: Comfortable at this time . Eyes: Grossly normal lids, irises & conjunctiva . ENT: grossly tongue is normal . Neck: no obvious mass . Cardiovascular: S1 S2 normal no gallop . Respiratory: Good air entry no rhonchi or rales . Abdomen: soft . Skin: no rash seen on limited exam . Musculoskeletal: not rigid . Psychiatric:unable to assess . Neurologic: no seizure no involuntary movements         Lab Data:   Basic Metabolic Panel: Recent Labs  Lab 02/24/18 0709  NA 132*  K 5.1  CL 97*  CO2 26  GLUCOSE 111*  BUN 26*  CREATININE 0.68  CALCIUM 7.9*    ABG: Recent Labs  Lab 02/22/18 2040  PHART 7.458*  PCO2ART 40.1  PO2ART 119*  HCO3 28.0  O2SAT 98.9    Liver Function Tests: No results for input(s): AST, ALT, ALKPHOS, BILITOT, PROT, ALBUMIN in the last 168 hours. No results for input(s): LIPASE, AMYLASE in the last 168 hours. No results for input(s): AMMONIA in the last 168 hours.  CBC: Recent Labs  Lab 02/24/18 0709  WBC 10.5  HGB 9.7*  HCT 30.7*  MCV 85.8  PLT 226    Cardiac Enzymes: No results for input(s): CKTOTAL, CKMB, CKMBINDEX, TROPONINI in the last 168 hours.  BNP (last 3 results) No results for input(s): BNP in the last 8760  hours.  ProBNP (last 3 results) No results for input(s): PROBNP in the last 8760 hours.  Radiological Exams: No results found.  Assessment/Plan Principal Problem:   Acute on chronic respiratory failure with hypoxia (HCC) Active Problems:   Acute metabolic encephalopathy   Lobar pneumonia (HCC)   Atrial fibrillation, chronic (HCC)   Obstructive chronic bronchitis without exacerbation (HCC)   1. Acute on chronic respiratory failure with hypoxia we will continue with weaning on pressure support as noted the goal is 24 hours.  We did a leak test which was good so I think hopefully we should be able to move towards extubation.  If the patient fails extubation he will need to be reintubated by anesthesia because of his anatomy. 2. Metabolic encephalopathy unchanged continue supportive care 3. Lobar pneumonia treated clinically improved 4. Chronic atrial fibrillation rate controlled 5. COPD at baseline   I have personally seen and evaluated the patient, evaluated laboratory and imaging results, formulated the assessment and plan and placed orders. The Patient requires high complexity decision making for assessment and support.  Case was discussed on Rounds with the Respiratory Therapy Staff  Yevonne Pax, MD [facility] Pulmonary Critical Care Medicine Sleep Medicine

## 2018-02-27 DIAGNOSIS — G9341 Metabolic encephalopathy: Secondary | ICD-10-CM | POA: Diagnosis not present

## 2018-02-27 DIAGNOSIS — J181 Lobar pneumonia, unspecified organism: Secondary | ICD-10-CM | POA: Diagnosis not present

## 2018-02-27 DIAGNOSIS — I482 Chronic atrial fibrillation: Secondary | ICD-10-CM | POA: Diagnosis not present

## 2018-02-27 DIAGNOSIS — J9621 Acute and chronic respiratory failure with hypoxia: Secondary | ICD-10-CM | POA: Diagnosis not present

## 2018-02-27 LAB — C DIFFICILE QUICK SCREEN W PCR REFLEX
C DIFFICILE (CDIFF) TOXIN: NEGATIVE
C DIFFICLE (CDIFF) ANTIGEN: POSITIVE — AB

## 2018-02-27 LAB — CLOSTRIDIUM DIFFICILE BY PCR, REFLEXED: Toxigenic C. Difficile by PCR: POSITIVE — AB

## 2018-02-27 NOTE — Progress Notes (Signed)
Pulmonary Critical Care Medicine [facility] [place]   PULMONARY CRITICAL CARE SERVICE  PROGRESS NOTE  Date of Service: 02/27/2018  Peter Mcdowell  ZOX:[id number]  DOB: 11/08/1945   DOA: 02/21/2018  Referring Physician: Carron Curie, MD  HPI: Peter Mcdowell is a 72 y.o. male seen for follow up of Acute on Chronic Respiratory Failure.  Patient is on pressure support has failed to complete 24 hours of pressure support.  The patient is going to end up needing to have a tracheostomy we have attempted several times now and he has failed.  Do not think he will do well after extubation so therefore we will request a tracheostomy.  Medications: Reviewed on Rounds  Physical Exam:  Vitals: Temperature 97.6 pulse 71 respiratory rate 20 blood pressure 118/64 saturations 100%  Ventilator Settings mode of ventilation pressure support FiO2 28% tidal volume 543 pressure support 12 PEEP 5  . General: Comfortable at this time . Eyes: Grossly normal lids, irises & conjunctiva . ENT: grossly tongue is normal . Neck: no obvious mass . Cardiovascular: S1 S2 normal no gallop . Respiratory: Scattered rhonchi . Abdomen: soft . Skin: no rash seen on limited exam . Musculoskeletal: not rigid . Psychiatric:unable to assess . Neurologic: no seizure no involuntary movements         Lab Data:   Basic Metabolic Panel: Recent Labs  Lab 02/24/18 0709  NA 132*  K 5.1  CL 97*  CO2 26  GLUCOSE 111*  BUN 26*  CREATININE 0.68  CALCIUM 7.9*    ABG: Recent Labs  Lab 02/22/18 2040  PHART 7.458*  PCO2ART 40.1  PO2ART 119*  HCO3 28.0  O2SAT 98.9    Liver Function Tests: No results for input(s): AST, ALT, ALKPHOS, BILITOT, PROT, ALBUMIN in the last 168 hours. No results for input(s): LIPASE, AMYLASE in the last 168 hours. No results for input(s): AMMONIA in the last 168 hours.  CBC: Recent Labs  Lab 02/24/18 0709  WBC 10.5  HGB 9.7*  HCT 30.7*  MCV 85.8  PLT 226    Cardiac  Enzymes: No results for input(s): CKTOTAL, CKMB, CKMBINDEX, TROPONINI in the last 168 hours.  BNP (last 3 results) No results for input(s): BNP in the last 8760 hours.  ProBNP (last 3 results) No results for input(s): PROBNP in the last 8760 hours.  Radiological Exams: No results found.  Assessment/Plan Principal Problem:   Acute on chronic respiratory failure with hypoxia (HCC) Active Problems:   Acute metabolic encephalopathy   Lobar pneumonia (HCC)   Atrial fibrillation, chronic (HCC)   Obstructive chronic bronchitis without exacerbation (HCC)   1. Acute on chronic respiratory failure with hypoxia we will continue with pressure support wean for now but requests tracheostomy as discussed.  We will continue with pulmonary toilet secretion management. 2. Metabolic encephalopathy unchanged 3. Lobar pneumonia treated 4. Chronic atrial fibrillation rate controlled 5. COPD severe disease we will continue with supportive care   I have personally seen and evaluated the patient, evaluated laboratory and imaging results, formulated the assessment and plan and placed orders. The Patient requires high complexity decision making for assessment and support.  Case was discussed on Rounds with the Respiratory Therapy Staff  Yevonne Pax, MD [facility] Pulmonary Critical Care Medicine Sleep Medicine

## 2018-02-28 DIAGNOSIS — J181 Lobar pneumonia, unspecified organism: Secondary | ICD-10-CM | POA: Diagnosis not present

## 2018-02-28 DIAGNOSIS — J9621 Acute and chronic respiratory failure with hypoxia: Secondary | ICD-10-CM | POA: Diagnosis not present

## 2018-02-28 DIAGNOSIS — G9341 Metabolic encephalopathy: Secondary | ICD-10-CM | POA: Diagnosis not present

## 2018-02-28 DIAGNOSIS — I482 Chronic atrial fibrillation: Secondary | ICD-10-CM | POA: Diagnosis not present

## 2018-02-28 LAB — BASIC METABOLIC PANEL
Anion gap: 9 (ref 5–15)
BUN: 22 mg/dL (ref 8–23)
CALCIUM: 7.8 mg/dL — AB (ref 8.9–10.3)
CO2: 26 mmol/L (ref 22–32)
CREATININE: 0.71 mg/dL (ref 0.61–1.24)
Chloride: 97 mmol/L — ABNORMAL LOW (ref 98–111)
GLUCOSE: 106 mg/dL — AB (ref 70–99)
Potassium: 4.2 mmol/L (ref 3.5–5.1)
Sodium: 132 mmol/L — ABNORMAL LOW (ref 135–145)

## 2018-02-28 LAB — CBC
HCT: 29.1 % — ABNORMAL LOW (ref 39.0–52.0)
HEMOGLOBIN: 9.1 g/dL — AB (ref 13.0–17.0)
MCH: 27 pg (ref 26.0–34.0)
MCHC: 31.3 g/dL (ref 30.0–36.0)
MCV: 86.4 fL (ref 78.0–100.0)
Platelets: 285 10*3/uL (ref 150–400)
RBC: 3.37 MIL/uL — AB (ref 4.22–5.81)
RDW: 17.1 % — ABNORMAL HIGH (ref 11.5–15.5)
WBC: 9.4 10*3/uL (ref 4.0–10.5)

## 2018-02-28 LAB — DIGOXIN LEVEL: DIGOXIN LVL: 0.4 ng/mL — AB (ref 0.8–2.0)

## 2018-02-28 NOTE — Progress Notes (Signed)
Pulmonary Critical Care Medicine [facility] [place]   PULMONARY CRITICAL CARE SERVICE  PROGRESS NOTE  Date of Service: 02/28/2018  Peter Mcdowell  ZOX:[id number]  DOB: 1945/09/12   DOA: 19-Feb-2018  Referring Physician: Carron Curie, MD  HPI: Peter Mcdowell is a 72 y.o. male seen for follow up of Acute on Chronic Respiratory Failure.  Patient was able to do 24 hours on pressure support however still has copious amounts of secretions noted and therefore I think will be more prudent to do a tracheostomy as has been already discussed.  Medications: Reviewed on Rounds  Physical Exam:  Vitals: Temperature 96.5 pulse 102 respiratory rate 20 blood pressure 113/58 saturations 98%  Ventilator Settings mode of ventilation pressure support FiO2 28% tidal volume is 46 pressure support 12 PEEP 5  . General: Comfortable at this time . Eyes: Grossly normal lids, irises & conjunctiva . ENT: grossly tongue is normal . Neck: no obvious mass . Cardiovascular: S1 S2 normal no gallop . Respiratory: No rhonchi or rales are noted . Abdomen: soft . Skin: no rash seen on limited exam . Musculoskeletal: not rigid . Psychiatric:unable to assess . Neurologic: no seizure no involuntary movements         Lab Data:   Basic Metabolic Panel: Recent Labs  Lab 02/24/18 0709 02/28/18 0502  NA 132* 132*  K 5.1 4.2  CL 97* 97*  CO2 26 26  GLUCOSE 111* 106*  BUN 26* 22  CREATININE 0.68 0.71  CALCIUM 7.9* 7.8*    ABG: Recent Labs  Lab 02/22/18 2040  PHART 7.458*  PCO2ART 40.1  PO2ART 119*  HCO3 28.0  O2SAT 98.9    Liver Function Tests: No results for input(s): AST, ALT, ALKPHOS, BILITOT, PROT, ALBUMIN in the last 168 hours. No results for input(s): LIPASE, AMYLASE in the last 168 hours. No results for input(s): AMMONIA in the last 168 hours.  CBC: Recent Labs  Lab 02/24/18 0709 02/28/18 0502  WBC 10.5 9.4  HGB 9.7* 9.1*  HCT 30.7* 29.1*  MCV 85.8 86.4  PLT 226 285     Cardiac Enzymes: No results for input(s): CKTOTAL, CKMB, CKMBINDEX, TROPONINI in the last 168 hours.  BNP (last 3 results) No results for input(s): BNP in the last 8760 hours.  ProBNP (last 3 results) No results for input(s): PROBNP in the last 8760 hours.  Radiological Exams: No results found.  Assessment/Plan Principal Problem:   Acute on chronic respiratory failure with hypoxia (HCC) Active Problems:   Acute metabolic encephalopathy   Lobar pneumonia (HCC)   Atrial fibrillation, chronic (HCC)   Obstructive chronic bronchitis without exacerbation (HCC)   1. Acute on chronic respiratory failure with hypoxia continue to wean on pressure support as discussed previously ultimately will need a tracheostomy consultation ordered 2. Acute metabolic encephalopathy unchanged 3. Lobar pneumonia treated we will monitor 4. Chronic atrial fibrillation rate is controlled 5. COPD continue present management   I have personally seen and evaluated the patient, evaluated laboratory and imaging results, formulated the assessment and plan and placed orders. The Patient requires high complexity decision making for assessment and support.  Case was discussed on Rounds with the Respiratory Therapy Staff  Yevonne Pax, MD [facility] Pulmonary Critical Care Medicine Sleep Medicine

## 2018-03-01 DIAGNOSIS — J9621 Acute and chronic respiratory failure with hypoxia: Secondary | ICD-10-CM | POA: Diagnosis not present

## 2018-03-01 DIAGNOSIS — I482 Chronic atrial fibrillation: Secondary | ICD-10-CM | POA: Diagnosis not present

## 2018-03-01 DIAGNOSIS — G9341 Metabolic encephalopathy: Secondary | ICD-10-CM | POA: Diagnosis not present

## 2018-03-01 DIAGNOSIS — J181 Lobar pneumonia, unspecified organism: Secondary | ICD-10-CM | POA: Diagnosis not present

## 2018-03-01 NOTE — Progress Notes (Signed)
Pulmonary Critical Care Medicine [facility] [place]   PULMONARY CRITICAL CARE SERVICE  PROGRESS NOTE  Date of Service: 03/01/2018  Peter Mcdowell  BFX:[id number]  DOB: [birth year]   DOA: 01/28/2018  Referring Physician: Carron Curie, MD  HPI: Peter Mcdowell is a 72 y.o. male seen for follow up of Acute on Chronic Respiratory Failure.  She is on pressure support is completed 24 hours currently is on 20% oxygen  Medications: Reviewed on Rounds  Physical Exam:  Vitals: Temperature 98.9 pulse 81 respiratory 14 blood pressure 143/69 saturations 99%  Ventilator Settings mode of ventilation pressure support FiO2 20% tidal volume 559 pressure support 12 PEEP 5  . General: Comfortable at this time . Eyes: Grossly normal lids, irises & conjunctiva . ENT: grossly tongue is normal . Neck: no obvious mass . Cardiovascular: S1 S2 normal no gallop . Respiratory: No rhonchi or rales are noted . Abdomen: soft . Skin: no rash seen on limited exam . Musculoskeletal: not rigid . Psychiatric:unable to assess . Neurologic: no seizure no involuntary movements         Lab Data:   Basic Metabolic Panel: Recent Labs  Lab 02/24/18 0709 02/28/18 0502  NA 132* 132*  K 5.1 4.2  CL 97* 97*  CO2 26 26  GLUCOSE 111* 106*  BUN 26* 22  CREATININE 0.68 0.71  CALCIUM 7.9* 7.8*    ABG: Recent Labs  Lab 02/22/18 2040  PHART 7.458*  PCO2ART 40.1  PO2ART 119*  HCO3 28.0  O2SAT 98.9    Liver Function Tests: No results for input(s): AST, ALT, ALKPHOS, BILITOT, PROT, ALBUMIN in the last 168 hours. No results for input(s): LIPASE, AMYLASE in the last 168 hours. No results for input(s): AMMONIA in the last 168 hours.  CBC: Recent Labs  Lab 02/24/18 0709 02/28/18 0502  WBC 10.5 9.4  HGB 9.7* 9.1*  HCT 30.7* 29.1*  MCV 85.8 86.4  PLT 226 285    Cardiac Enzymes: No results for input(s): CKTOTAL, CKMB, CKMBINDEX, TROPONINI in the last 168 hours.  BNP (last 3 results) No  results for input(s): BNP in the last 8760 hours.  ProBNP (last 3 results) No results for input(s): PROBNP in the last 8760 hours.  Radiological Exams: No results found.  Assessment/Plan Principal Problem:   Acute on chronic respiratory failure with hypoxia (HCC) Active Problems:   Acute metabolic encephalopathy   Lobar pneumonia (HCC)   Atrial fibrillation, chronic (HCC)   Obstructive chronic bronchitis without exacerbation (HCC)   1. Acute on chronic respiratory failure with hypoxia we will continue with weaning on pressure support titrate oxygen continue pulmonary toilet. 2. Acute metabolic encephalopathy grossly unchanged continue present management 3. Lobar pneumonia treated we will follow 4. Chronic atrial fibrillation rate is controlled 5. Obstructive COPD we will continue present management prognosis chronic   I have personally seen and evaluated the patient, evaluated laboratory and imaging results, formulated the assessment and plan and placed orders. The Patient requires high complexity decision making for assessment and support.  Case was discussed on Rounds with the Respiratory Therapy Staff  Yevonne Pax, MD [facility] Pulmonary Critical Care Medicine Sleep Medicine

## 2018-03-02 DIAGNOSIS — I482 Chronic atrial fibrillation: Secondary | ICD-10-CM | POA: Diagnosis not present

## 2018-03-02 DIAGNOSIS — G9341 Metabolic encephalopathy: Secondary | ICD-10-CM | POA: Diagnosis not present

## 2018-03-02 DIAGNOSIS — J181 Lobar pneumonia, unspecified organism: Secondary | ICD-10-CM | POA: Diagnosis not present

## 2018-03-02 DIAGNOSIS — J9621 Acute and chronic respiratory failure with hypoxia: Secondary | ICD-10-CM | POA: Diagnosis not present

## 2018-03-02 LAB — DIGOXIN LEVEL: DIGOXIN LVL: 0.5 ng/mL — AB (ref 0.8–2.0)

## 2018-03-02 NOTE — Progress Notes (Signed)
Pulmonary Critical Care Medicine [facility] [place]   PULMONARY CRITICAL CARE SERVICE  PROGRESS NOTE  Date of Service: 03/02/2018  Peter Mcdowell  DTO:[id number]  DOB: 12/24/1945   DOA: 02/06/2018  Referring Physician: Carron Curie, MD  HPI: Peter Mcdowell is a 72 y.o. male seen for follow up of Acute on Chronic Respiratory Failure.  Patient is back on the ventilator and full support.  Right now is on assist control mode  Medications: Reviewed on Rounds  Physical Exam:  Vitals: Temperature 97.9 pulse 95 respiratory rate 21 blood pressure 121/60 saturations 99%  Ventilator Settings mode of ventilation assist control FiO2 20% tidal volume 491 PEEP 5  . General: Comfortable at this time . Eyes: Grossly normal lids, irises & conjunctiva . ENT: grossly tongue is normal . Neck: no obvious mass . Cardiovascular: S1 S2 normal no gallop . Respiratory: No rhonchi no rales . Abdomen: soft . Skin: no rash seen on limited exam . Musculoskeletal: not rigid . Psychiatric:unable to assess . Neurologic: no seizure no involuntary movements         Lab Data:   Basic Metabolic Panel: Recent Labs  Lab 02/24/18 0709 02/28/18 0502  NA 132* 132*  K 5.1 4.2  CL 97* 97*  CO2 26 26  GLUCOSE 111* 106*  BUN 26* 22  CREATININE 0.68 0.71  CALCIUM 7.9* 7.8*    ABG: No results for input(s): PHART, PCO2ART, PO2ART, HCO3, O2SAT in the last 168 hours.  Liver Function Tests: No results for input(s): AST, ALT, ALKPHOS, BILITOT, PROT, ALBUMIN in the last 168 hours. No results for input(s): LIPASE, AMYLASE in the last 168 hours. No results for input(s): AMMONIA in the last 168 hours.  CBC: Recent Labs  Lab 02/24/18 0709 02/28/18 0502  WBC 10.5 9.4  HGB 9.7* 9.1*  HCT 30.7* 29.1*  MCV 85.8 86.4  PLT 226 285    Cardiac Enzymes: No results for input(s): CKTOTAL, CKMB, CKMBINDEX, TROPONINI in the last 168 hours.  BNP (last 3 results) No results for input(s): BNP in the last  8760 hours.  ProBNP (last 3 results) No results for input(s): PROBNP in the last 8760 hours.  Radiological Exams: No results found.  Assessment/Plan Principal Problem:   Acute on chronic respiratory failure with hypoxia (HCC) Active Problems:   Acute metabolic encephalopathy   Lobar pneumonia (HCC)   Atrial fibrillation, chronic (HCC)   Obstructive chronic bronchitis without exacerbation (HCC)   1. Acute on chronic respiratory failure with hypoxia patient has not been able to consistently continue with the weaning on pressure support.  We are therefore asked for tracheostomy done which hopefully will be done next week. 2. Acute metabolic encephalopathy unchanged 3. Lobar pneumonia treated we will monitor 4. Chronic atrial fibrillation rate is controlled 5. COPD at baseline   I have personally seen and evaluated the patient, evaluated laboratory and imaging results, formulated the assessment and plan and placed orders. The Patient requires high complexity decision making for assessment and support.  Case was discussed on Rounds with the Respiratory Therapy Staff  Yevonne Pax, MD [facility] Pulmonary Critical Care Medicine Sleep Medicine

## 2018-03-03 ENCOUNTER — Encounter (HOSPITAL_COMMUNITY): Payer: Medicare Other

## 2018-03-03 DIAGNOSIS — G9341 Metabolic encephalopathy: Secondary | ICD-10-CM | POA: Diagnosis not present

## 2018-03-03 DIAGNOSIS — J181 Lobar pneumonia, unspecified organism: Secondary | ICD-10-CM | POA: Diagnosis not present

## 2018-03-03 DIAGNOSIS — J9621 Acute and chronic respiratory failure with hypoxia: Secondary | ICD-10-CM | POA: Diagnosis not present

## 2018-03-03 DIAGNOSIS — I482 Chronic atrial fibrillation: Secondary | ICD-10-CM | POA: Diagnosis not present

## 2018-03-03 LAB — CBC WITH DIFFERENTIAL/PLATELET
Abs Immature Granulocytes: 0 10*3/uL (ref 0.0–0.1)
Basophils Absolute: 0 10*3/uL (ref 0.0–0.1)
Basophils Relative: 0 %
EOS PCT: 0 %
Eosinophils Absolute: 0 10*3/uL (ref 0.0–0.7)
HEMATOCRIT: 29.8 % — AB (ref 39.0–52.0)
HEMOGLOBIN: 9.1 g/dL — AB (ref 13.0–17.0)
IMMATURE GRANULOCYTES: 1 %
LYMPHS ABS: 0.8 10*3/uL (ref 0.7–4.0)
LYMPHS PCT: 11 %
MCH: 26.8 pg (ref 26.0–34.0)
MCHC: 30.5 g/dL (ref 30.0–36.0)
MCV: 87.9 fL (ref 78.0–100.0)
Monocytes Absolute: 0.9 10*3/uL (ref 0.1–1.0)
Monocytes Relative: 11 %
NEUTROS PCT: 77 %
Neutro Abs: 6 10*3/uL (ref 1.7–7.7)
Platelets: 267 10*3/uL (ref 150–400)
RBC: 3.39 MIL/uL — AB (ref 4.22–5.81)
RDW: 17.2 % — ABNORMAL HIGH (ref 11.5–15.5)
WBC: 7.7 10*3/uL (ref 4.0–10.5)

## 2018-03-03 LAB — BASIC METABOLIC PANEL
ANION GAP: 8 (ref 5–15)
BUN: 13 mg/dL (ref 8–23)
CHLORIDE: 99 mmol/L (ref 98–111)
CO2: 30 mmol/L (ref 22–32)
Calcium: 8.1 mg/dL — ABNORMAL LOW (ref 8.9–10.3)
Creatinine, Ser: 0.67 mg/dL (ref 0.61–1.24)
GFR calc non Af Amer: >60 mL/min (ref >60)
GLUCOSE: 106 mg/dL — AB (ref 70–99)
Potassium: 3.7 mmol/L (ref 3.5–5.1)
Sodium: 137 mmol/L (ref 135–145)

## 2018-03-03 NOTE — Progress Notes (Signed)
Pulmonary Critical Care Medicine [facility] [place]   PULMONARY CRITICAL CARE SERVICE  PROGRESS NOTE  Date of Service: 03/03/2018  Peter Mcdowell  VPC:[id number]  DOB: 18-Dec-1945   DOA: 02/09/2018  Referring Physician: Carron Curie, MD  HPI: Peter Mcdowell is a 72 y.o. male seen for follow up of Acute on Chronic Respiratory Failure.  Not weaning today back on full support and assist control mode.  Currently is on 28% FiO2 with a PEEP of 5  Medications: Reviewed on Rounds  Physical Exam:  Vitals: Temperature 98.1 pulse 120 respiratory rate 18 blood pressure 111/57 saturation 99%  Ventilator Settings mode of ventilation assist control FiO2 28% tidal volume 501 PEEP 5  . General: Comfortable at this time . Eyes: Grossly normal lids, irises & conjunctiva . ENT: grossly tongue is normal . Neck: no obvious mass . Cardiovascular: S1 S2 normal no gallop . Respiratory: No rhonchi no rales . Abdomen: soft . Skin: no rash seen on limited exam . Musculoskeletal: not rigid . Psychiatric:unable to assess . Neurologic: no seizure no involuntary movements         Lab Data:   Basic Metabolic Panel: Recent Labs  Lab 02/28/18 0502 03/03/18 0550  NA 132* 137  K 4.2 3.7  CL 97* 99  CO2 26 30  GLUCOSE 106* 106*  BUN 22 13  CREATININE 0.71 0.67  CALCIUM 7.8* 8.1*    ABG: No results for input(s): PHART, PCO2ART, PO2ART, HCO3, O2SAT in the last 168 hours.  Liver Function Tests: No results for input(s): AST, ALT, ALKPHOS, BILITOT, PROT, ALBUMIN in the last 168 hours. No results for input(s): LIPASE, AMYLASE in the last 168 hours. No results for input(s): AMMONIA in the last 168 hours.  CBC: Recent Labs  Lab 02/28/18 0502 03/03/18 0550  WBC 9.4 7.7  NEUTROABS  --  6.0  HGB 9.1* 9.1*  HCT 29.1* 29.8*  MCV 86.4 87.9  PLT 285 267    Cardiac Enzymes: No results for input(s): CKTOTAL, CKMB, CKMBINDEX, TROPONINI in the last 168 hours.  BNP (last 3 results) No  results for input(s): BNP in the last 8760 hours.  ProBNP (last 3 results) No results for input(s): PROBNP in the last 8760 hours.  Radiological Exams: No results found.  Assessment/Plan Principal Problem:   Acute on chronic respiratory failure with hypoxia (HCC) Active Problems:   Acute metabolic encephalopathy   Lobar pneumonia (HCC)   Atrial fibrillation, chronic (HCC)   Obstructive chronic bronchitis without exacerbation (HCC)   1. Acute on chronic respiratory failure with hypoxia patient needs to have a tracheostomy done I spoke with the daughter was actually present in the room at the time of evaluation.  The patient has a complicated airway tracheostomy however even tracheostomy will be difficult more than likely and I do not know yet if ENT will be able to perform the surgery.  We have to wait for their input.  I have explained this to the daughter.  If he is not able to have a tracheostomy then we will need to make decisions regarding possibility of comfort care she is going to discuss with her mother. 2. Lobar pneumonia treated clinically proven 3. Chronic atrial fibrillation rate is controlled we will continue to follow 4. COPD severe disease at baseline 5. Metabolic encephalopathy grossly unchanged   I have personally seen and evaluated the patient, evaluated laboratory and imaging results, formulated the assessment and plan and placed orders. The Patient requires high complexity decision making for assessment  and support.  Case was discussed on Rounds with the Respiratory Therapy Staff time 35 minutes family conversation and discussion with medical staff  Yevonne Pax, MD [facility] Pulmonary Critical Care Medicine Sleep Medicine

## 2018-03-04 ENCOUNTER — Other Ambulatory Visit (HOSPITAL_COMMUNITY): Payer: Medicare Other

## 2018-03-04 ENCOUNTER — Ambulatory Visit (HOSPITAL_COMMUNITY): Payer: Medicare Other | Attending: Internal Medicine

## 2018-03-04 DIAGNOSIS — G9341 Metabolic encephalopathy: Secondary | ICD-10-CM | POA: Diagnosis not present

## 2018-03-04 DIAGNOSIS — Z029 Encounter for administrative examinations, unspecified: Secondary | ICD-10-CM | POA: Diagnosis not present

## 2018-03-04 DIAGNOSIS — I482 Chronic atrial fibrillation: Secondary | ICD-10-CM | POA: Diagnosis not present

## 2018-03-04 DIAGNOSIS — J181 Lobar pneumonia, unspecified organism: Secondary | ICD-10-CM | POA: Diagnosis not present

## 2018-03-04 DIAGNOSIS — J9621 Acute and chronic respiratory failure with hypoxia: Secondary | ICD-10-CM | POA: Diagnosis not present

## 2018-03-04 DIAGNOSIS — I96 Gangrene, not elsewhere classified: Secondary | ICD-10-CM | POA: Diagnosis not present

## 2018-03-04 NOTE — Progress Notes (Signed)
VASCULAR LAB PRELIMINARY  ARTERIAL  ABI completed:  Bilateral ankle-brachial index indicates noncompressible lower extremities arteries.     RIGHT    LEFT    PRESSURE WAVEFORM  PRESSURE WAVEFORM  BRACHIAL N/A Abnormal waveforms BRACHIAL 98 Triphasic  DP 131 Abnormal waveforms DP 114 Abnormal waveforms  PT 104 Abnormal waveforms PT 130 Abnormal waveforms  GREAT TOE 71  GREAT TOE 45 Dampened monophasic    RIGHT LEFT  ABI/TBI 1.34/0.72 1.33/0.46     HONGYING  Jerimiah Wolman, RVT 03/04/2018, 11:56 AM

## 2018-03-04 NOTE — Progress Notes (Signed)
Pulmonary Critical Care Medicine [facility] [place]   PULMONARY CRITICAL CARE SERVICE  PROGRESS NOTE  Date of Service: 03/04/2018  Peter Mcdowell  GIT:[id number]  DOB: September 18, 1945   DOA: 02/10/2018  Referring Physician: Carron Curie, MD  HPI: Peter Mcdowell is a 72 y.o. male seen for follow up of Acute on Chronic Respiratory Failure.  He remains on pressure support mode the goal is to for trying to do a 24 hours right now is on 28% tidal volume 300 pressure support 12 PEEP 5  Medications: Reviewed on Rounds  Physical Exam:  Vitals: Temperature 97.0 pulse 79 respiratory rate 23 blood pressure 126/61 saturations 100%  Ventilator Settings mode of ventilation pressure support FiO2 28% tidal volume 300 pressure support 12 PEEP 5  . General: Comfortable at this time . Eyes: Grossly normal lids, irises & conjunctiva . ENT: grossly tongue is normal . Neck: no obvious mass . Cardiovascular: S1 S2 normal no gallop . Respiratory: No rhonchi rales . Abdomen: soft . Skin: no rash seen on limited exam . Musculoskeletal: not rigid . Psychiatric:unable to assess . Neurologic: no seizure no involuntary movements         Lab Data:   Basic Metabolic Panel: Recent Labs  Lab 02/28/18 0502 03/03/18 0550  NA 132* 137  K 4.2 3.7  CL 97* 99  CO2 26 30  GLUCOSE 106* 106*  BUN 22 13  CREATININE 0.71 0.67  CALCIUM 7.8* 8.1*    ABG: No results for input(s): PHART, PCO2ART, PO2ART, HCO3, O2SAT in the last 168 hours.  Liver Function Tests: No results for input(s): AST, ALT, ALKPHOS, BILITOT, PROT, ALBUMIN in the last 168 hours. No results for input(s): LIPASE, AMYLASE in the last 168 hours. No results for input(s): AMMONIA in the last 168 hours.  CBC: Recent Labs  Lab 02/28/18 0502 03/03/18 0550  WBC 9.4 7.7  NEUTROABS  --  6.0  HGB 9.1* 9.1*  HCT 29.1* 29.8*  MCV 86.4 87.9  PLT 285 267    Cardiac Enzymes: No results for input(s): CKTOTAL, CKMB, CKMBINDEX, TROPONINI  in the last 168 hours.  BNP (last 3 results) No results for input(s): BNP in the last 8760 hours.  ProBNP (last 3 results) No results for input(s): PROBNP in the last 8760 hours.  Radiological Exams: Dg Abd Portable 1v  Result Date: 03/04/2018 CLINICAL DATA:  NG placement EXAM: PORTABLE ABDOMEN - 1 VIEW COMPARISON:  02/05/2018 FINDINGS: NG tube in the body the stomach. Nonobstructive bowel gas pattern. Gas in nondilated large and small bowel loops. Numerous EKG leads over the abdomen. IMPRESSION: NG tube in the body the stomach.  Normal bowel gas pattern. Electronically Signed   By: Marlan Palau M.D.   On: 03/04/2018 17:12    Assessment/Plan Principal Problem:   Acute on chronic respiratory failure with hypoxia (HCC) Active Problems:   Acute metabolic encephalopathy   Lobar pneumonia (HCC)   Atrial fibrillation, chronic (HCC)   Obstructive chronic bronchitis without exacerbation (HCC)   1. Acute on chronic respiratory failure with hypoxia continue with pressure support mode titrate oxygen titrate rate down on pressure support of 12 PEEP 5 2. Acute metabolic encephalopathy grossly unchanged 3. Lobar pneumonia treated 4. Chronic atrial fibrillation rate controlled 5. COPD severe disease   I have personally seen and evaluated the patient, evaluated laboratory and imaging results, formulated the assessment and plan and placed orders. The Patient requires high complexity decision making for assessment and support.  Case was discussed on Rounds with  the Respiratory Therapy Staff  Yevonne Pax, MD [facility] Pulmonary Critical Care Medicine Sleep Medicine

## 2018-03-05 DIAGNOSIS — I482 Chronic atrial fibrillation: Secondary | ICD-10-CM | POA: Diagnosis not present

## 2018-03-05 DIAGNOSIS — J9621 Acute and chronic respiratory failure with hypoxia: Secondary | ICD-10-CM | POA: Diagnosis not present

## 2018-03-05 DIAGNOSIS — G9341 Metabolic encephalopathy: Secondary | ICD-10-CM | POA: Diagnosis not present

## 2018-03-05 DIAGNOSIS — J181 Lobar pneumonia, unspecified organism: Secondary | ICD-10-CM | POA: Diagnosis not present

## 2018-03-05 NOTE — Progress Notes (Signed)
Pulmonary Critical Care Medicine [facility] [place]   PULMONARY CRITICAL CARE SERVICE  PROGRESS NOTE  Date of Service: 03/05/2018  Peter Mcdowell  ZOX:[id number]  DOB: 06/02/1946   DOA: 02/14/2018  Referring Physician: Carron Curie, MD  HPI: Peter Mcdowell is a 72 y.o. male seen for follow up of Acute on Chronic Respiratory Failure.  Patient is on pressure support mode currently on 28% FiO2.  Tidal volume is 429 with a pressure support of 12 doing fairly well the patient's family wants to proceed with a tracheostomy  Medications: Reviewed on Rounds  Physical Exam:  Vitals: Temperature 99.0 pulse 100 respiratory 18 blood pressure 96/56 saturations 98%  Ventilator Settings mode of ventilation pressure support FiO2 28% tidal volume 429 pressure support 12 PEEP 5  . General: Comfortable at this time . Eyes: Grossly normal lids, irises & conjunctiva . ENT: grossly tongue is normal . Neck: no obvious mass . Cardiovascular: S1 S2 normal no gallop . Respiratory: Coarse breath sounds scattered rhonchi . Abdomen: soft . Skin: no rash seen on limited exam . Musculoskeletal: not rigid . Psychiatric:unable to assess . Neurologic: no seizure no involuntary movements         Lab Data:   Basic Metabolic Panel: Recent Labs  Lab 02/28/18 0502 03/03/18 0550  NA 132* 137  K 4.2 3.7  CL 97* 99  CO2 26 30  GLUCOSE 106* 106*  BUN 22 13  CREATININE 0.71 0.67  CALCIUM 7.8* 8.1*    ABG: No results for input(s): PHART, PCO2ART, PO2ART, HCO3, O2SAT in the last 168 hours.  Liver Function Tests: No results for input(s): AST, ALT, ALKPHOS, BILITOT, PROT, ALBUMIN in the last 168 hours. No results for input(s): LIPASE, AMYLASE in the last 168 hours. No results for input(s): AMMONIA in the last 168 hours.  CBC: Recent Labs  Lab 02/28/18 0502 03/03/18 0550  WBC 9.4 7.7  NEUTROABS  --  6.0  HGB 9.1* 9.1*  HCT 29.1* 29.8*  MCV 86.4 87.9  PLT 285 267    Cardiac Enzymes: No  results for input(s): CKTOTAL, CKMB, CKMBINDEX, TROPONINI in the last 168 hours.  BNP (last 3 results) No results for input(s): BNP in the last 8760 hours.  ProBNP (last 3 results) No results for input(s): PROBNP in the last 8760 hours.  Radiological Exams: Dg Abd Portable 1v  Result Date: 03/04/2018 CLINICAL DATA:  NG placement EXAM: PORTABLE ABDOMEN - 1 VIEW COMPARISON:  02/10/2018 FINDINGS: NG tube in the body the stomach. Nonobstructive bowel gas pattern. Gas in nondilated large and small bowel loops. Numerous EKG leads over the abdomen. IMPRESSION: NG tube in the body the stomach.  Normal bowel gas pattern. Electronically Signed   By: Marlan Palau M.D.   On: 03/04/2018 17:12    Assessment/Plan Principal Problem:   Acute on chronic respiratory failure with hypoxia (HCC) Active Problems:   Acute metabolic encephalopathy   Lobar pneumonia (HCC)   Atrial fibrillation, chronic (HCC)   Obstructive chronic bronchitis without exacerbation (HCC)   1. Acute on chronic respiratory failure with hypoxia continue with the weaning process awaiting tracheostomy once ENT sees the patient will be scheduled 2. Metabolic encephalopathy grossly unchanged 3. Lobar pneumonia treated 4. Chronic atrial fibrillation at baseline 5. COPD stable at this time we will continue present management   I have personally seen and evaluated the patient, evaluated laboratory and imaging results, formulated the assessment and plan and placed orders. The Patient requires high complexity decision making for assessment and  support.  Case was discussed on Rounds with the Respiratory Therapy Staff  Allyne Gee, MD [facility] Pulmonary Critical Care Medicine Sleep Medicine

## 2018-03-06 DIAGNOSIS — J9621 Acute and chronic respiratory failure with hypoxia: Secondary | ICD-10-CM | POA: Diagnosis not present

## 2018-03-06 DIAGNOSIS — J181 Lobar pneumonia, unspecified organism: Secondary | ICD-10-CM | POA: Diagnosis not present

## 2018-03-06 DIAGNOSIS — I482 Chronic atrial fibrillation: Secondary | ICD-10-CM | POA: Diagnosis not present

## 2018-03-06 DIAGNOSIS — G9341 Metabolic encephalopathy: Secondary | ICD-10-CM | POA: Diagnosis not present

## 2018-03-06 NOTE — Progress Notes (Signed)
Pulmonary Critical Care Medicine [facility] [place]   PULMONARY CRITICAL CARE SERVICE  PROGRESS NOTE  Date of Service: 03/06/2018  Peter Mcdowell  [id number]:[id number]  DOB: 1946/01/06   DOA: 01/27/2018  Referring Physician: Carron Curie, MD  HPI: Peter Mcdowell is a 72 y.o. male seen for follow up of Acute on Chronic Respiratory Failure.  Patient is on pressure support mode has apparently completed 24 hours.  Right now the patient is tolerating it fairly well.  He will still however need a tracheostomy which the family has now stated they want and also want to continue with aggressive measures.  Medications: Reviewed on Rounds  Physical Exam:  Vitals: Temperature 98.0 pulse 110 respiratory rate 22 blood pressure 95/55 saturations 97%  Ventilator Settings mode of ventilation pressure support FiO2 28% tidal volume 515 pressure support 12 PEEP 5  . General: Comfortable at this time . Eyes: Grossly normal lids, irises & conjunctiva . ENT: grossly tongue is normal . Neck: no obvious mass . Cardiovascular: S1 S2 normal no gallop . Respiratory: Coarse breath sounds no rhonchi . Abdomen: soft . Skin: no rash seen on limited exam . Musculoskeletal: not rigid . Psychiatric:unable to assess . Neurologic: no seizure no involuntary movements         Lab Data:   Basic Metabolic Panel: Recent Labs  Lab 02/28/18 0502 03/03/18 0550  NA 132* 137  K 4.2 3.7  CL 97* 99  CO2 26 30  GLUCOSE 106* 106*  BUN 22 13  CREATININE 0.71 0.67  CALCIUM 7.8* 8.1*    ABG: No results for input(s): PHART, PCO2ART, PO2ART, HCO3, O2SAT in the last 168 hours.  Liver Function Tests: No results for input(s): AST, ALT, ALKPHOS, BILITOT, PROT, ALBUMIN in the last 168 hours. No results for input(s): LIPASE, AMYLASE in the last 168 hours. No results for input(s): AMMONIA in the last 168 hours.  CBC: Recent Labs  Lab 02/28/18 0502 03/03/18 0550  WBC 9.4 7.7  NEUTROABS  --  6.0  HGB 9.1* 9.1*   HCT 29.1* 29.8*  MCV 86.4 87.9  PLT 285 267    Cardiac Enzymes: No results for input(s): CKTOTAL, CKMB, CKMBINDEX, TROPONINI in the last 168 hours.  BNP (last 3 results) No results for input(s): BNP in the last 8760 hours.  ProBNP (last 3 results) No results for input(s): PROBNP in the last 8760 hours.  Radiological Exams: No results found.  Assessment/Plan Principal Problem:   Acute on chronic respiratory failure with hypoxia (HCC) Active Problems:   Acute metabolic encephalopathy   Lobar pneumonia (HCC)   Atrial fibrillation, chronic (HCC)   Obstructive chronic bronchitis without exacerbation (HCC)   1. Acute on chronic respiratory failure with hypoxia continue with pressure support for now.  Ultimately patient is going to need a trach as already discussed once the trach is done I think you should be able to wean fairly quickly 2. Metabolic encephalopathy grossly unchanged 3. Lobar pneumonia treated we will continue to follow 4. Chronic atrial fibrillation rate is controlled 5. COPD at baseline we will continue with present management   I have personally seen and evaluated the patient, evaluated laboratory and imaging results, formulated the assessment and plan and placed orders. The Patient requires high complexity decision making for assessment and support.  Case was discussed on Rounds with the Respiratory Therapy Staff  Yevonne Pax, MD [facility] Pulmonary Critical Care Medicine Sleep Medicine

## 2018-03-07 ENCOUNTER — Encounter (HOSPITAL_COMMUNITY): Payer: Medicare Other | Admitting: Certified Registered Nurse Anesthetist

## 2018-03-07 ENCOUNTER — Other Ambulatory Visit (HOSPITAL_COMMUNITY): Payer: Medicare Other

## 2018-03-07 ENCOUNTER — Institutional Professional Consult (permissible substitution) (HOSPITAL_COMMUNITY): Payer: Medicare Other

## 2018-03-07 DIAGNOSIS — J181 Lobar pneumonia, unspecified organism: Secondary | ICD-10-CM | POA: Diagnosis not present

## 2018-03-07 DIAGNOSIS — G9341 Metabolic encephalopathy: Secondary | ICD-10-CM | POA: Diagnosis not present

## 2018-03-07 DIAGNOSIS — I482 Chronic atrial fibrillation: Secondary | ICD-10-CM | POA: Diagnosis not present

## 2018-03-07 DIAGNOSIS — J9621 Acute and chronic respiratory failure with hypoxia: Secondary | ICD-10-CM | POA: Diagnosis not present

## 2018-03-07 LAB — BLOOD GAS, ARTERIAL
ACID-BASE EXCESS: 3.7 mmol/L — AB (ref 0.0–2.0)
Bicarbonate: 27.6 mmol/L (ref 20.0–28.0)
O2 Content: 2 L/min
O2 SAT: 95.5 %
PATIENT TEMPERATURE: 98.6
PCO2 ART: 40.8 mmHg (ref 32.0–48.0)
pH, Arterial: 7.445 (ref 7.350–7.450)
pO2, Arterial: 81.4 mmHg — ABNORMAL LOW (ref 83.0–108.0)

## 2018-03-07 MED ORDER — SUCCINYLCHOLINE CHLORIDE 20 MG/ML IJ SOLN
INTRAMUSCULAR | Status: DC | PRN
  Administered 2018-03-07: 120 mg via INTRAVENOUS

## 2018-03-07 MED ORDER — PROPOFOL 10 MG/ML IV BOLUS
INTRAVENOUS | Status: DC | PRN
  Administered 2018-03-07: 120 mg via INTRAVENOUS

## 2018-03-07 NOTE — Progress Notes (Signed)
[facility] [place]   PULMONARY CRITICAL CARE SERVICE  PROGRESS NOTE  Date of Service: 03/07/2018  Peter Mcdowell  NWG:[id number]  DOB: [birth year]   DOA: Nov 13, 2017  Referring Physician: Carron CurieAli Hijazi, MD  HPI: Peter LeoDavid Morini is a 72 y.o. male seen for follow up of Acute on Chronic Respiratory Failure.  Comfortable without distress.  Patient was apparently extubated inadvertently he is now off the ventilator.  Patient has been on oxygen saturations are acceptable.  I am concerned however that he is not likely going to last off the ventilator demands of having to be reintubated.  I spoke with the respiratory therapist on rounds and explained to them that anesthesia must do the intubation if it comes to  Medications: Reviewed on Rounds  Physical Exam:  Vitals: Temperature 98.7 pulse 128 respiratory rate 28 blood pressure 116/72 saturations 98%  Ventilator Settings off the ventilator extubated  . General: Comfortable at this time . Eyes: Grossly normal lids, irises & conjunctiva . ENT: grossly tongue is normal . Neck: no obvious mass . Cardiovascular: S1 S2 normal no gallop . Respiratory: Coarse breath sounds few rhonchi . Abdomen: soft . Skin: no rash seen on limited exam . Musculoskeletal: not rigid . Psychiatric:unable to assess . Neurologic: no seizure no involuntary movements         Lab Data:   Basic Metabolic Panel: Recent Labs  Lab 03/03/18 0550  NA 137  K 3.7  CL 99  CO2 30  GLUCOSE 106*  BUN 13  CREATININE 0.67  CALCIUM 8.1*    ABG: No results for input(s): PHART, PCO2ART, PO2ART, HCO3, O2SAT in the last 168 hours.  Liver Function Tests: No results for input(s): AST, ALT, ALKPHOS, BILITOT, PROT, ALBUMIN in the last 168 hours. No results for input(s): LIPASE, AMYLASE in the last 168 hours. No results for input(s): AMMONIA in the last 168 hours.  CBC: Recent Labs  Lab 03/03/18 0550  WBC 7.7  NEUTROABS 6.0   HGB 9.1*  HCT 29.8*  MCV 87.9  PLT 267    Cardiac Enzymes: No results for input(s): CKTOTAL, CKMB, CKMBINDEX, TROPONINI in the last 168 hours.  BNP (last 3 results) No results for input(s): BNP in the last 8760 hours.  ProBNP (last 3 results) No results for input(s): PROBNP in the last 8760 hours.  Radiological Exams: No results found.  Assessment/Plan Principal Problem:   Acute on chronic respiratory failure with hypoxia (HCC) Active Problems:   Acute metabolic encephalopathy   Lobar pneumonia (HCC)   Atrial fibrillation, chronic (HCC)   Obstructive chronic bronchitis without exacerbation (HCC)   1. Acute on chronic respiratory failure with hypoxia now is extubated off the ventilator however as noted above I do not think he is going to last we will keep airway available at the bedside. 2. Metabolic encephalopathy grossly unchanged continue with supportive care 3. Lobar pneumonia treated we will follow 4. Chronic atrial fibrillation rate is controlled 5. 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 The Patient requires high complexity decision making for assessment and support.  Case was discussed on Rounds with the Respiratory Therapy Staff  Yevonne PaxSaadat A Khan, MD [facility] Pulmonary Critical Care Medicine Sleep Medicine

## 2018-03-07 NOTE — Anesthesia Procedure Notes (Signed)
Procedure Name: Intubation Date/Time: 03/07/2018 5:15 PM Performed by: Rosiland OzMeyers, Brandon, CRNA Pre-anesthesia Checklist: Patient identified, Emergency Drugs available, Suction available, Patient being monitored and Timeout performed Patient Re-evaluated:Patient Re-evaluated prior to induction Oxygen Delivery Method: Ambu bag Induction Type: Rapid sequence and Cricoid Pressure applied Laryngoscope Size: Glidescope and 4 Grade View: Grade I Tube type: Subglottic suction tube Tube size: 7.5 mm Number of attempts: 1 Airway Equipment and Method: Stylet and Video-laryngoscopy Placement Confirmation: ETT inserted through vocal cords under direct vision,  breath sounds checked- equal and bilateral and CO2 detector Secured at: 23 cm Tube secured with: Tape Difficulty Due To: Difficulty was anticipated Comments: [facility], elective glidescope

## 2018-03-09 LAB — CBC
HEMATOCRIT: 31.3 % — AB (ref 39.0–52.0)
Hemoglobin: 9.7 g/dL — ABNORMAL LOW (ref 13.0–17.0)
MCH: 26.6 pg (ref 26.0–34.0)
MCHC: 31 g/dL (ref 30.0–36.0)
MCV: 86 fL (ref 78.0–100.0)
Platelets: 312 10*3/uL (ref 150–400)
RBC: 3.64 MIL/uL — AB (ref 4.22–5.81)
RDW: 16.8 % — ABNORMAL HIGH (ref 11.5–15.5)
WBC: 12.6 10*3/uL — AB (ref 4.0–10.5)

## 2018-03-09 LAB — BASIC METABOLIC PANEL
ANION GAP: 9 (ref 5–15)
BUN: 15 mg/dL (ref 8–23)
CHLORIDE: 97 mmol/L — AB (ref 98–111)
CO2: 25 mmol/L (ref 22–32)
Calcium: 8 mg/dL — ABNORMAL LOW (ref 8.9–10.3)
Creatinine, Ser: 0.55 mg/dL — ABNORMAL LOW (ref 0.61–1.24)
GFR calc Af Amer: >60 mL/min (ref >60)
GFR calc non Af Amer: >60 mL/min (ref >60)
Glucose, Bld: 113 mg/dL — ABNORMAL HIGH (ref 70–99)
POTASSIUM: 3.8 mmol/L (ref 3.5–5.1)
SODIUM: 131 mmol/L — AB (ref 135–145)

## 2018-03-11 ENCOUNTER — Encounter (HOSPITAL_COMMUNITY): Payer: Self-pay | Admitting: Anesthesiology

## 2018-03-11 ENCOUNTER — Encounter: Admission: AD | Disposition: E | Payer: Self-pay | Source: Other Acute Inpatient Hospital | Attending: Internal Medicine

## 2018-03-11 DIAGNOSIS — J9621 Acute and chronic respiratory failure with hypoxia: Secondary | ICD-10-CM | POA: Diagnosis not present

## 2018-03-11 DIAGNOSIS — J181 Lobar pneumonia, unspecified organism: Secondary | ICD-10-CM | POA: Diagnosis not present

## 2018-03-11 DIAGNOSIS — G9341 Metabolic encephalopathy: Secondary | ICD-10-CM | POA: Diagnosis not present

## 2018-03-11 DIAGNOSIS — I482 Chronic atrial fibrillation: Secondary | ICD-10-CM | POA: Diagnosis not present

## 2018-03-11 SURGERY — CREATION, TRACHEOSTOMY
Anesthesia: General

## 2018-03-11 NOTE — Progress Notes (Signed)
[facility]ulmonary [facility] [place]   PULMONARY CRITICAL CARE SERVICE  PROGRESS NOTE  Date of Service: 03/01/2018  Peter Mcdowell  ZOX:[id number]  DOB: Oct 14, 1945   DOA: 02/12/2018  Referring Physician: Carron CurieAli Hijazi, MD  HPI: Peter LeoDavid Bachand is a 72 y.o. male seen for follow up of Acute on Chronic Respiratory Failure.  She was reintubated he failed to stay off the ventilator.  Right now family is still undecided regarding possibility of doing a tracheostomy or comfort care.  I spoken with them last week regarding the possibility of comfort care and his overall prognosis being poor  Medications: Reviewed on Rounds  Physical Exam:  Vitals: Temperature 99.3 pulse 64 respiratory 18 blood pressure 140/78 saturation 99%  Ventilator Settings mode of ventilation is assist control FiO2 28% tidal volume 522 PEEP 5  . General: Comfortable at this time . Eyes: Grossly normal lids, irises & conjunctiva . ENT: grossly tongue is normal . Neck: no obvious mass . Cardiovascular: S1 S2 normal no gallop . Respiratory: No rhonchi or rales are noted . Abdomen: soft . Skin: no rash seen on limited exam . Musculoskeletal: not rigid . Psychiatric:unable to assess . Neurologic: no seizure no involuntary movements         Lab Data:   Basic Metabolic Panel: Recent Labs  Lab 03/09/18 0640  NA 131*  K 3.8  CL 97*  CO2 25  GLUCOSE 113*  BUN 15  CREATININE 0.55*  CALCIUM 8.0*    ABG: Recent Labs  Lab 03/07/18 1100  PHART 7.445  PCO2ART 40.8  PO2ART 81.4*  HCO3 27.6  O2SAT 95.5    Liver Function Tests: No results for input(s): AST, ALT, ALKPHOS, BILITOT, PROT, ALBUMIN in the last 168 hours. No results for input(s): LIPASE, AMYLASE in the last 168 hours. No results for input(s): AMMONIA in the last 168 hours.  CBC: Recent Labs  Lab 03/09/18 0640  WBC 12.6*  HGB 9.7*  HCT 31.3*  MCV 86.0  PLT 312    Cardiac Enzymes: No results for input(s): CKTOTAL,  CKMB, CKMBINDEX, TROPONINI in the last 168 hours.  BNP (last 3 results) No results for input(s): BNP in the last 8760 hours.  ProBNP (last 3 results) No results for input(s): PROBNP in the last 8760 hours.  Radiological Exams: No results found.  Assessment/Plan Principal Problem:   Acute on chronic respiratory failure with hypoxia (HCC) Active Problems:   Acute metabolic encephalopathy   Lobar pneumonia (HCC)   Atrial fibrillation, chronic (HCC)   Obstructive chronic bronchitis without exacerbation (HCC)   1. Acute on chronic respiratory failure with hypoxia we will continue full vent support at this time.  Patient's family is going to decide regarding possibility of doing a tracheostomy.  As noted previously I think his overall prognosis is poor however they will make a final decision. 2. Metabolic encephalopathy grossly unchanged we will continue with present management. 3. Lobar pneumonia treated with antibiotics 4. Chronic atrial fibrillation rate is controlled 5. COPD severe disease we will continue with present management   I have personally seen and evaluated the patient, evaluated laboratory and imaging results, formulated the assessment and plan and placed orders. The Patient requires high complexity decision making for assessment and support.  Case was discussed on Rounds with the Respiratory Therapy Staff  Yevonne PaxSaadat A Khan, MD [facility] Pulmonary Critical Care Medicine Sleep Medicine

## 2018-03-12 DIAGNOSIS — J9621 Acute and chronic respiratory failure with hypoxia: Secondary | ICD-10-CM | POA: Diagnosis not present

## 2018-03-12 DIAGNOSIS — J181 Lobar pneumonia, unspecified organism: Secondary | ICD-10-CM | POA: Diagnosis not present

## 2018-03-12 DIAGNOSIS — G9341 Metabolic encephalopathy: Secondary | ICD-10-CM | POA: Diagnosis not present

## 2018-03-12 DIAGNOSIS — I482 Chronic atrial fibrillation: Secondary | ICD-10-CM | POA: Diagnosis not present

## 2018-03-12 NOTE — Progress Notes (Signed)
[facility] [place]   PULMONARY CRITICAL CARE SERVICE  PROGRESS NOTE  Date of Service: 03/12/2018  Peter Mcdowell  JWJ:[id number]  DOB: Nov 24, 1945   DOA: 01/27/2018  Referring Physician: Carron CurieAli Hijazi, MD  HPI: Peter Mcdowell is a 72 y.o. male seen for follow up of Acute on Chronic Respiratory Failure.  Patient right now is on 70% oxygen on full support assist control mode.  Patient is supposed to be withdrawn as far as the ventilator is concerned.  Patient is decided to go with comfort care.  Medications: Reviewed on Rounds  Physical Exam:  Vitals: Temperature 97.2 pulse 54 respiratory 26 blood pressure 147/87 saturations 96%  Ventilator Settings mode of ventilation assist control FiO2 70% tidal volume 470 PEEP 5  . General: Comfortable at this time . Eyes: Grossly normal lids, irises & conjunctiva . ENT: grossly tongue is normal . Neck: no obvious mass . Cardiovascular: S1 S2 normal no gallop . Respiratory: No rhonchi no rales are noted . Abdomen: soft . Skin: no rash seen on limited exam . Musculoskeletal: not rigid . Psychiatric:unable to assess . Neurologic: no seizure no involuntary movements         Lab Data:   Basic Metabolic Panel: Recent Labs  Lab 03/09/18 0640  NA 131*  K 3.8  CL 97*  CO2 25  GLUCOSE 113*  BUN 15  CREATININE 0.55*  CALCIUM 8.0*    ABG: Recent Labs  Lab 03/07/18 1100  PHART 7.445  PCO2ART 40.8  PO2ART 81.4*  HCO3 27.6  O2SAT 95.5    Liver Function Tests: No results for input(s): AST, ALT, ALKPHOS, BILITOT, PROT, ALBUMIN in the last 168 hours. No results for input(s): LIPASE, AMYLASE in the last 168 hours. No results for input(s): AMMONIA in the last 168 hours.  CBC: Recent Labs  Lab 03/09/18 0640  WBC 12.6*  HGB 9.7*  HCT 31.3*  MCV 86.0  PLT 312    Cardiac Enzymes: No results for input(s): CKTOTAL, CKMB, CKMBINDEX, TROPONINI in the last 168 hours.  BNP (last 3  results) No results for input(s): BNP in the last 8760 hours.  ProBNP (last 3 results) No results for input(s): PROBNP in the last 8760 hours.  Radiological Exams: No results found.  Assessment/Plan Principal Problem:   Acute on chronic respiratory failure with hypoxia (HCC) Active Problems:   Acute metabolic encephalopathy   Lobar pneumonia (HCC)   Atrial fibrillation, chronic (HCC)   Obstructive chronic bronchitis without exacerbation (HCC)   1. Acute on chronic respiratory failure with hypoxia patient continues with the ventilator right now the family will be coming in today and then withdrawn patient's ventilator and keep him comfortable.  Patient has already been made comfort care discussed on rounds with the primary care physician. 2. Metabolic encephalopathy grossly unchanged 3. Lobar pneumonia treated 4. Chronic atrial fibrillation periodic tachycardia 5. COPD severe disease poor prognosis   I have personally seen and evaluated the patient, evaluated laboratory and imaging results, formulated the assessment and plan and placed orders. The Patient requires high complexity decision making for assessment and support.  Case was discussed on Rounds with the Respiratory Therapy Staff  Yevonne PaxSaadat A Ramie Erman, MD [facility] Pulmonary Critical Care Medicine Sleep Medicine

## 2018-03-26 DEATH — deceased

## 2018-10-01 IMAGING — DX DG ABD PORTABLE 1V
1 series · 1 of 1 positions shown · non-contrast
Comparison: Abdominal radiograph 03/04/2018

CLINICAL DATA: Patient status post NG tube placement.

EXAM:
PORTABLE ABDOMEN - 1 VIEW

[abdomen]
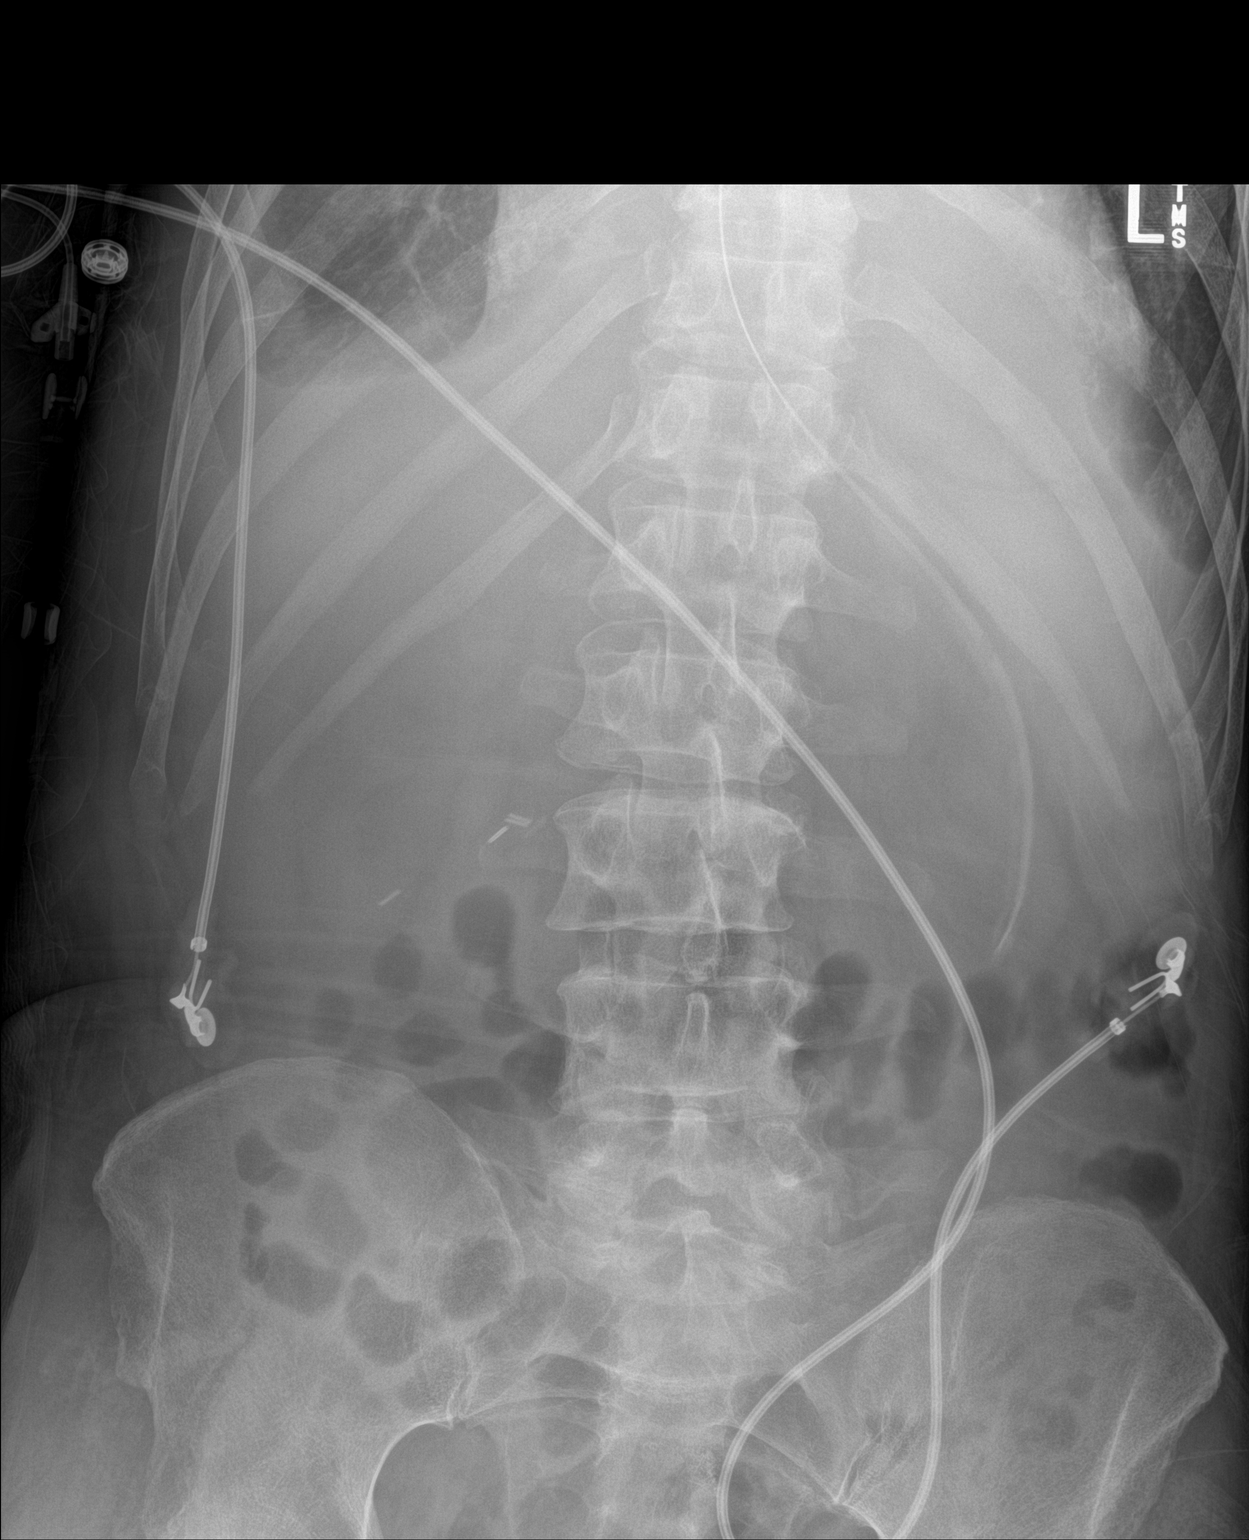

[1 of 1 positions shown; findings below may reference images not displayed]

FINDINGS: Enteric tube tip and side-port project over the stomach. Small
bilateral pleural effusions. Cardiomegaly. Bibasilar heterogeneous
opacities. Right upper quadrant surgical clips. Nonobstructed bowel
gas pattern.
IMPRESSION: NG tube tip and side-port project over the stomach.
